# Patient Record
Sex: Female | Born: 1985 | Race: White | Hispanic: No | Marital: Married | Smoking: Current every day smoker
Health system: Southern US, Community
[De-identification: ages and names within clinical notes are randomized; demographics above are authoritative.]

## PROBLEM LIST (undated history)

## (undated) DIAGNOSIS — I10 Essential (primary) hypertension: Secondary | ICD-10-CM

## (undated) HISTORY — PX: TUBAL LIGATION: SHX77

---

## 2004-04-13 ENCOUNTER — Emergency Department: Payer: Self-pay | Admitting: Emergency Medicine

## 2004-07-18 ENCOUNTER — Emergency Department: Payer: Self-pay | Admitting: Emergency Medicine

## 2004-08-21 ENCOUNTER — Emergency Department: Payer: Self-pay | Admitting: Emergency Medicine

## 2004-08-28 ENCOUNTER — Emergency Department: Payer: Self-pay | Admitting: Internal Medicine

## 2004-09-05 ENCOUNTER — Emergency Department: Payer: Self-pay | Admitting: Emergency Medicine

## 2004-09-07 ENCOUNTER — Emergency Department: Payer: Self-pay | Admitting: Unknown Physician Specialty

## 2004-10-23 ENCOUNTER — Emergency Department: Payer: Self-pay | Admitting: Emergency Medicine

## 2004-10-24 ENCOUNTER — Emergency Department: Payer: Self-pay | Admitting: General Practice

## 2004-10-29 ENCOUNTER — Emergency Department: Payer: Self-pay | Admitting: Emergency Medicine

## 2004-10-30 ENCOUNTER — Emergency Department: Payer: Self-pay | Admitting: General Practice

## 2005-06-11 ENCOUNTER — Emergency Department: Payer: Self-pay | Admitting: Emergency Medicine

## 2005-06-17 ENCOUNTER — Emergency Department: Payer: Self-pay | Admitting: Emergency Medicine

## 2005-07-03 ENCOUNTER — Emergency Department: Payer: Self-pay | Admitting: Emergency Medicine

## 2005-07-08 ENCOUNTER — Emergency Department: Payer: Self-pay | Admitting: General Practice

## 2005-07-11 ENCOUNTER — Emergency Department: Payer: Self-pay | Admitting: Internal Medicine

## 2005-07-17 ENCOUNTER — Emergency Department: Payer: Self-pay | Admitting: Emergency Medicine

## 2005-07-25 ENCOUNTER — Emergency Department: Payer: Self-pay | Admitting: Emergency Medicine

## 2005-08-03 ENCOUNTER — Emergency Department: Payer: Self-pay | Admitting: Emergency Medicine

## 2005-08-04 ENCOUNTER — Emergency Department: Payer: Self-pay | Admitting: Emergency Medicine

## 2005-08-06 ENCOUNTER — Emergency Department: Payer: Self-pay | Admitting: Emergency Medicine

## 2005-10-01 ENCOUNTER — Emergency Department: Payer: Self-pay | Admitting: General Practice

## 2005-10-01 ENCOUNTER — Other Ambulatory Visit: Payer: Self-pay

## 2005-10-04 ENCOUNTER — Emergency Department: Payer: Self-pay | Admitting: Emergency Medicine

## 2006-03-23 ENCOUNTER — Emergency Department: Payer: Self-pay | Admitting: Emergency Medicine

## 2006-04-20 ENCOUNTER — Emergency Department: Payer: Self-pay | Admitting: Emergency Medicine

## 2006-06-19 ENCOUNTER — Emergency Department: Payer: Self-pay | Admitting: Emergency Medicine

## 2006-07-17 ENCOUNTER — Emergency Department (HOSPITAL_COMMUNITY): Admission: EM | Admit: 2006-07-17 | Discharge: 2006-07-17 | Payer: Self-pay | Admitting: Emergency Medicine

## 2006-08-18 ENCOUNTER — Emergency Department (HOSPITAL_COMMUNITY): Admission: EM | Admit: 2006-08-18 | Discharge: 2006-08-19 | Payer: Self-pay | Admitting: Emergency Medicine

## 2006-10-11 ENCOUNTER — Emergency Department: Payer: Self-pay | Admitting: Emergency Medicine

## 2006-12-30 ENCOUNTER — Emergency Department: Payer: Self-pay | Admitting: Emergency Medicine

## 2007-01-13 ENCOUNTER — Emergency Department: Payer: Self-pay | Admitting: Emergency Medicine

## 2007-02-05 ENCOUNTER — Emergency Department: Payer: Self-pay | Admitting: Emergency Medicine

## 2007-02-25 ENCOUNTER — Emergency Department (HOSPITAL_COMMUNITY): Admission: EM | Admit: 2007-02-25 | Discharge: 2007-02-25 | Payer: Self-pay | Admitting: Emergency Medicine

## 2007-03-16 ENCOUNTER — Other Ambulatory Visit: Payer: Self-pay

## 2007-03-16 ENCOUNTER — Emergency Department: Payer: Self-pay | Admitting: Emergency Medicine

## 2007-03-20 ENCOUNTER — Emergency Department: Payer: Self-pay | Admitting: Emergency Medicine

## 2007-03-20 ENCOUNTER — Emergency Department (HOSPITAL_COMMUNITY): Admission: EM | Admit: 2007-03-20 | Discharge: 2007-03-20 | Payer: Self-pay | Admitting: Emergency Medicine

## 2007-03-22 IMAGING — CT CT CERVICAL SPINE WITHOUT CONTRAST
3 series · 16 of 33 positions shown, 19 images · non-contrast
Comparison: none

REASON FOR EXAM: Injury
COMMENTS:

PROCEDURE:     CT  - CT CERVICAL SPINE WO  - August 04, 2005  [DATE]
RESULT:     Cervical spine CT scan was performed. Axial, sagittal and
coronal imaging was obtained.

[Series 2: axial · axial · 0.36mm/px · z∈[+192,+366]mm · 8 of 103 slices shown, 10 images]
[im 8/103  soft-tissue]
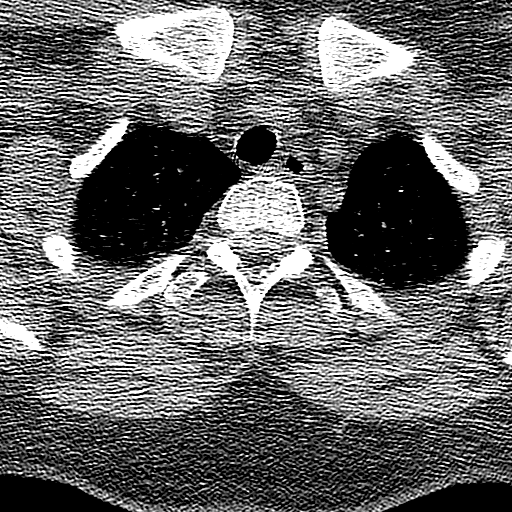
[im 8/103  bone]
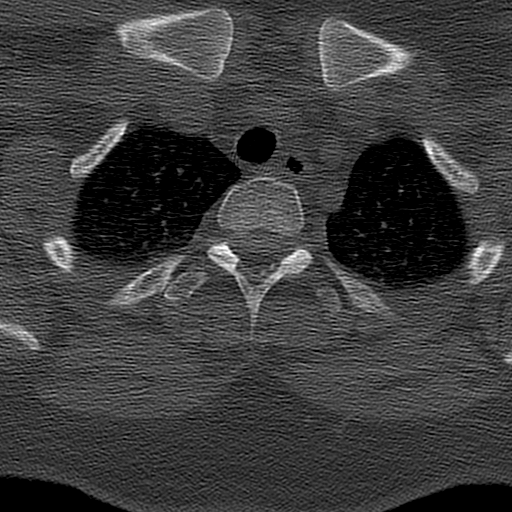
[im 24/103  bone]
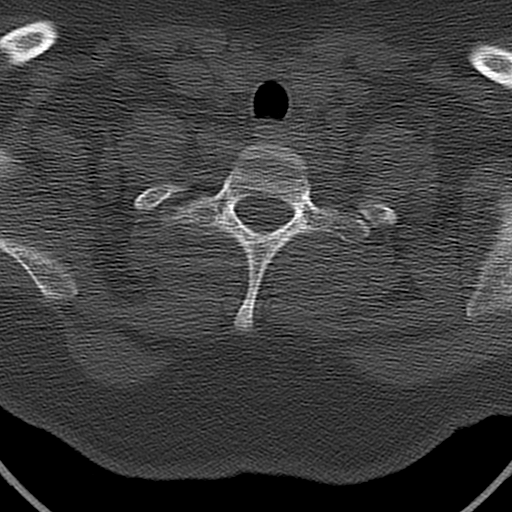
[im 32/103  bone]
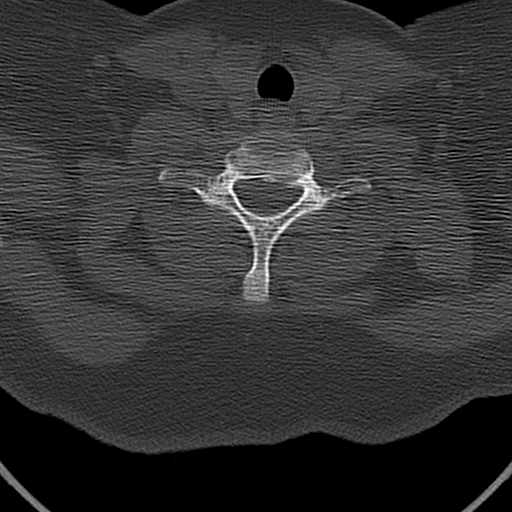
[im 48/103  bone]
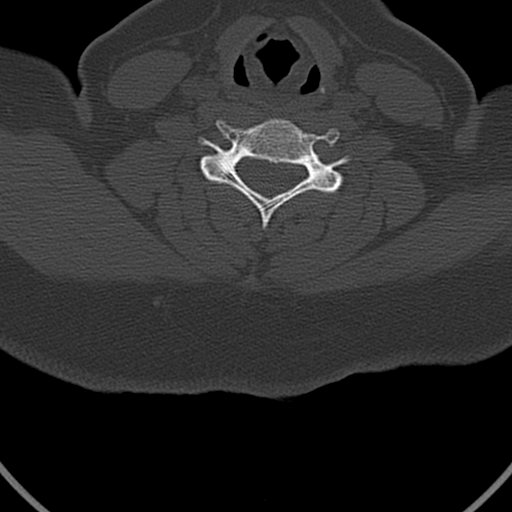
[im 55/103  soft-tissue]
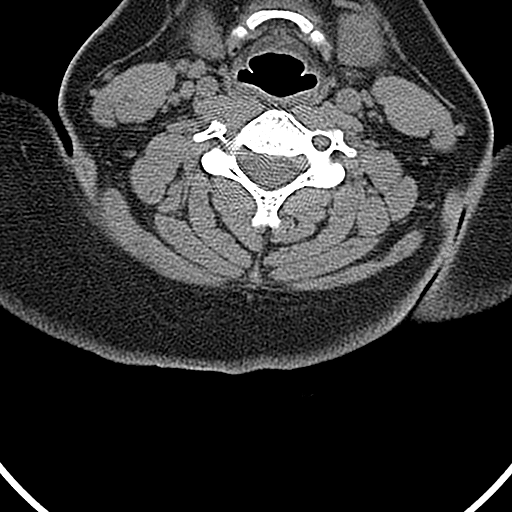
[im 55/103  bone]
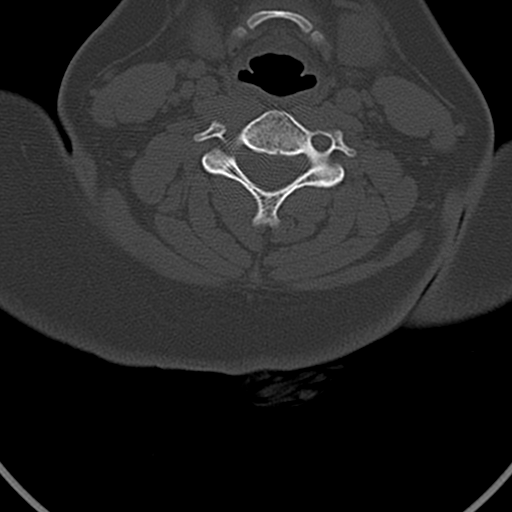
[im 71/103  bone]
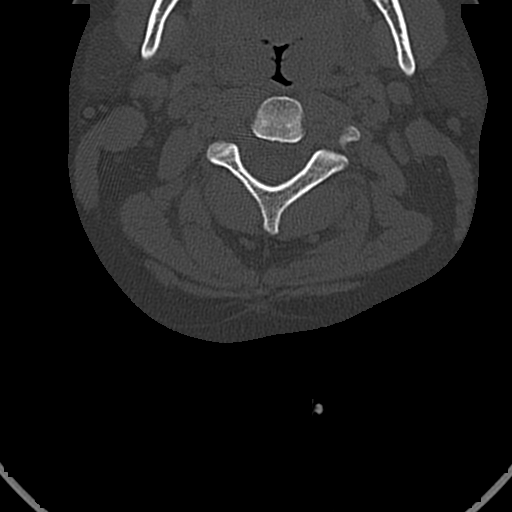
[im 79/103  bone]
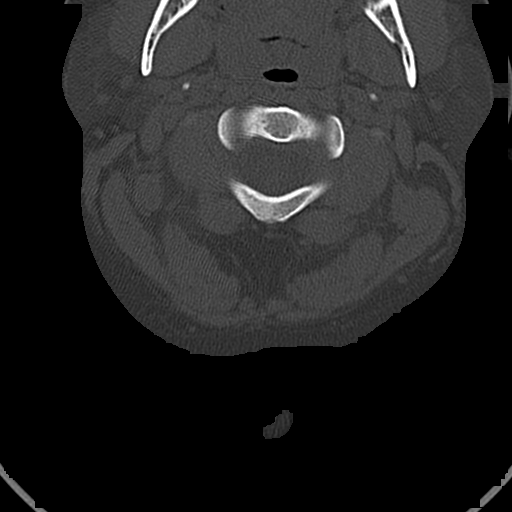
[im 95/103  bone]
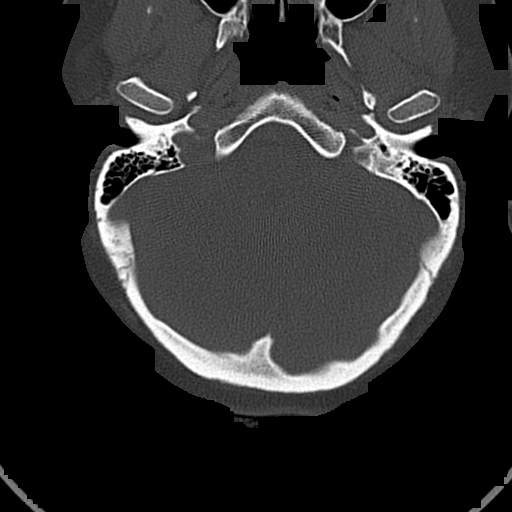

[Series 4: coronal · coronal · 0.39mm/px · 3 of 56 slices shown]
[im 12/56  bone]
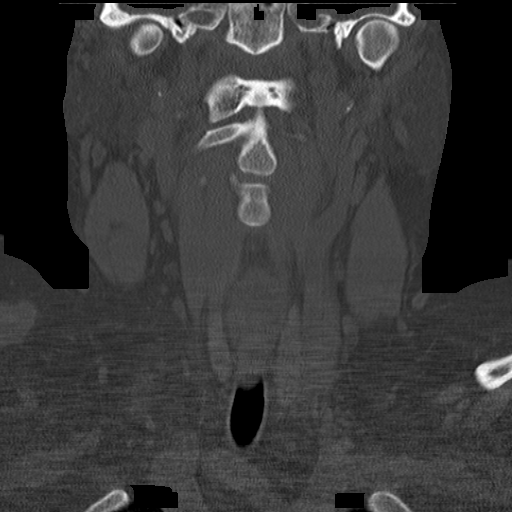
[im 23/56  bone]
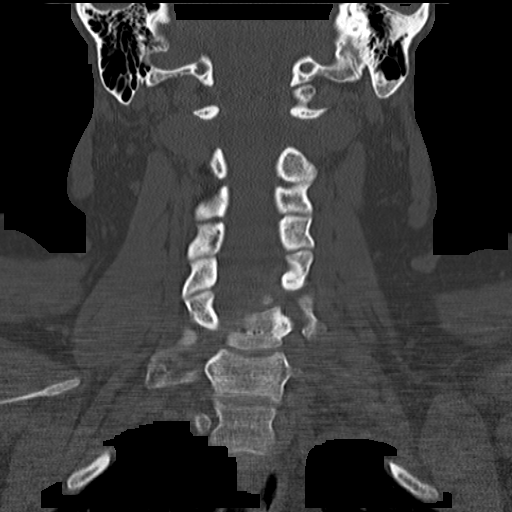
[im 34/56  bone]
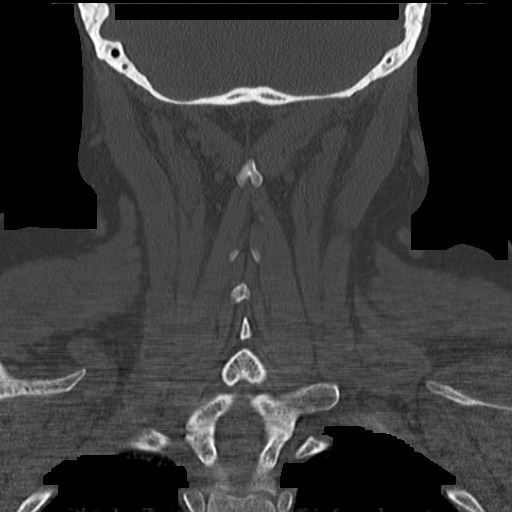

[Series 6: sagittal · sagittal · 0.40mm/px · 5 of 64 slices shown, 6 images]
[im 22/64  bone]
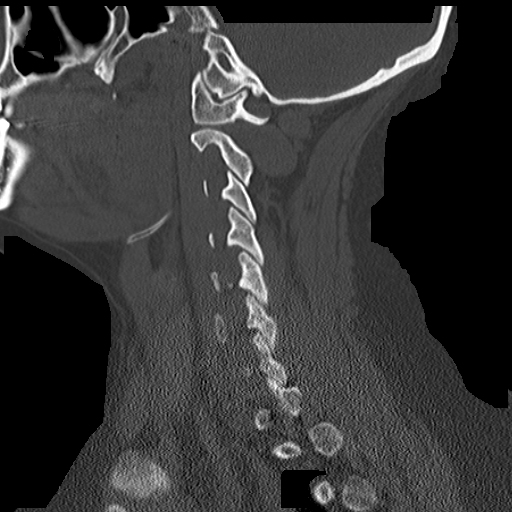
[im 27/64  bone]
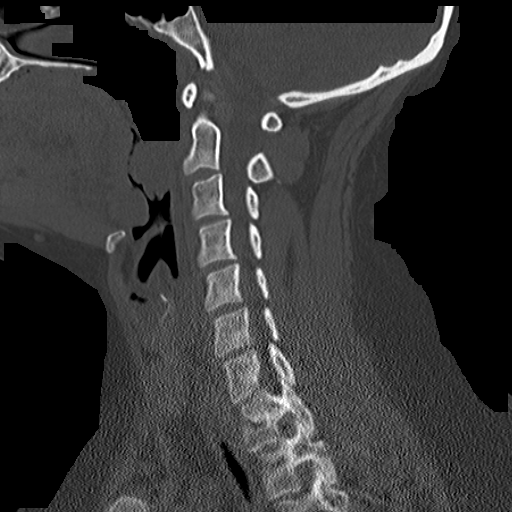
[im 32/64  soft-tissue]
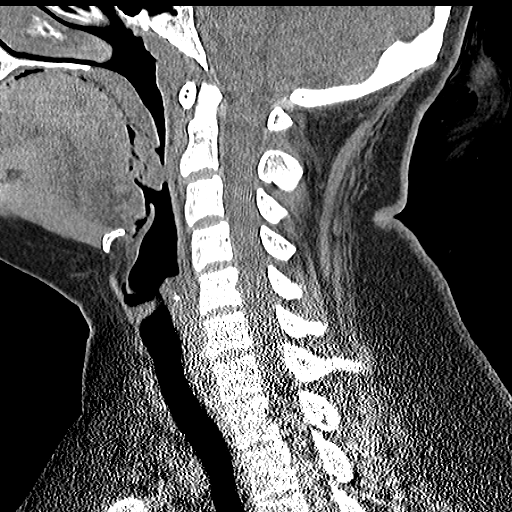
[im 32/64  bone]
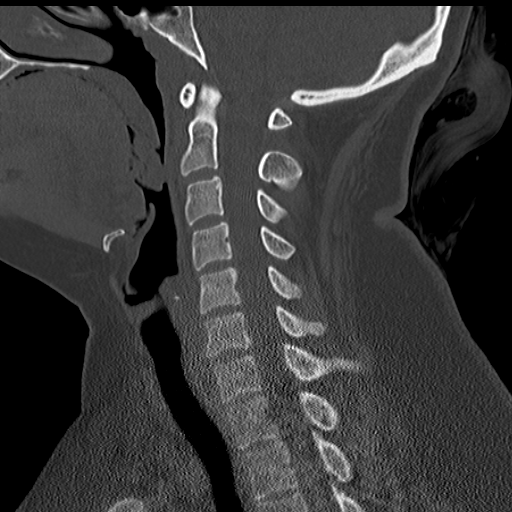
[im 37/64  bone]
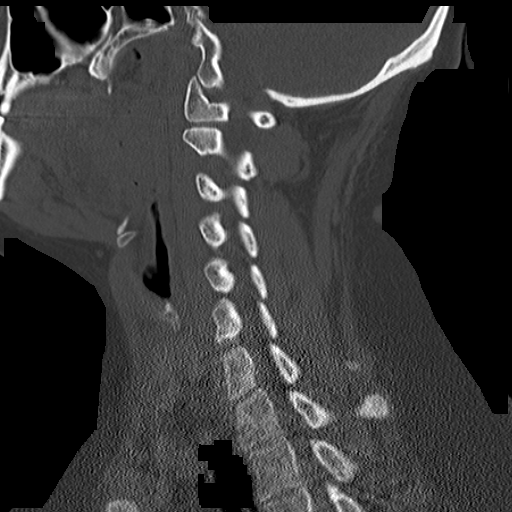
[im 43/64  bone]
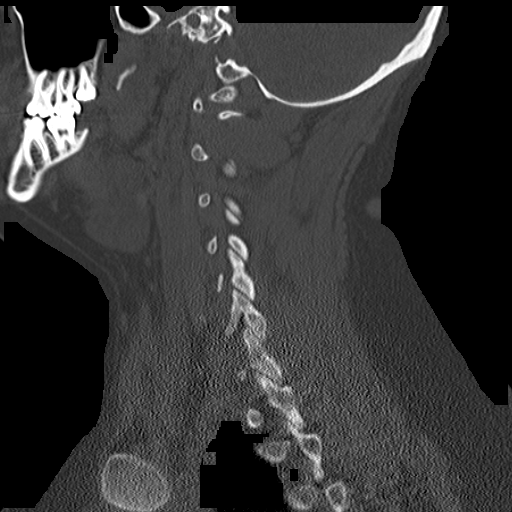

[16 of 33 positions shown; findings below may reference images not displayed]

FINDINGS: Normal alignment and curvature is noted. The vertebral body
heights are intact. The intervertebral disc spaces are intact. The odontoid
appears intact. No acute fractures are noted.
IMPRESSION: No fracture is identified. The vertebral body heights are
maintained.

The report was called to the Emergency Room at the conclusion of dictation.

## 2007-04-18 ENCOUNTER — Emergency Department: Payer: Self-pay | Admitting: Emergency Medicine

## 2007-06-26 ENCOUNTER — Emergency Department: Payer: Self-pay | Admitting: Emergency Medicine

## 2007-11-19 ENCOUNTER — Emergency Department: Payer: Self-pay | Admitting: Emergency Medicine

## 2007-12-16 ENCOUNTER — Emergency Department (HOSPITAL_COMMUNITY): Admission: EM | Admit: 2007-12-16 | Discharge: 2007-12-16 | Payer: Self-pay | Admitting: Emergency Medicine

## 2008-01-04 ENCOUNTER — Emergency Department (HOSPITAL_COMMUNITY): Admission: EM | Admit: 2008-01-04 | Discharge: 2008-01-04 | Payer: Self-pay | Admitting: Emergency Medicine

## 2008-02-16 ENCOUNTER — Emergency Department (HOSPITAL_COMMUNITY): Admission: EM | Admit: 2008-02-16 | Discharge: 2008-02-16 | Payer: Self-pay | Admitting: Emergency Medicine

## 2008-07-07 ENCOUNTER — Emergency Department: Payer: Self-pay | Admitting: Internal Medicine

## 2008-07-10 ENCOUNTER — Emergency Department: Payer: Self-pay | Admitting: Emergency Medicine

## 2008-07-14 ENCOUNTER — Emergency Department: Payer: Self-pay | Admitting: Emergency Medicine

## 2008-08-03 ENCOUNTER — Emergency Department: Payer: Self-pay | Admitting: Emergency Medicine

## 2008-09-18 ENCOUNTER — Emergency Department: Payer: Self-pay | Admitting: Emergency Medicine

## 2008-10-31 IMAGING — CT CT MAXILLOFACIAL WITHOUT CONTRAST
1 series · 16 of 38 positions shown, 20 images · non-contrast
Comparison: none

REASON FOR EXAM: FALL
COMMENTS:

[Series 4: coronal · coronal · 0.35mm/px · 16 of 39 slices shown, 20 images]
[im 3/39  brain]
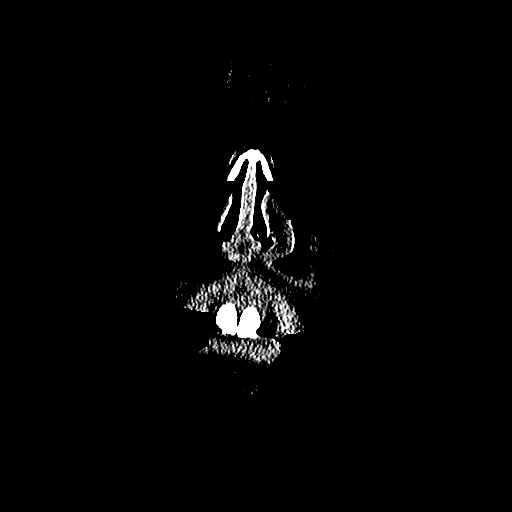
[im 3/39  bone]
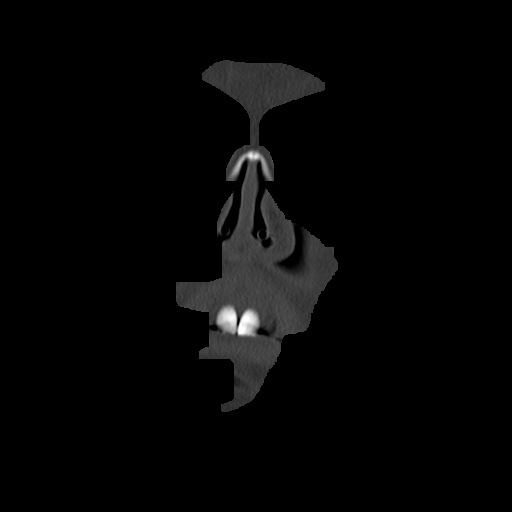
[im 5/39  bone]
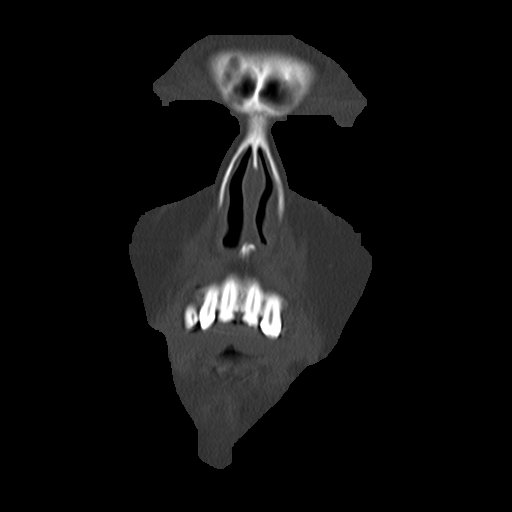
[im 7/39  bone]
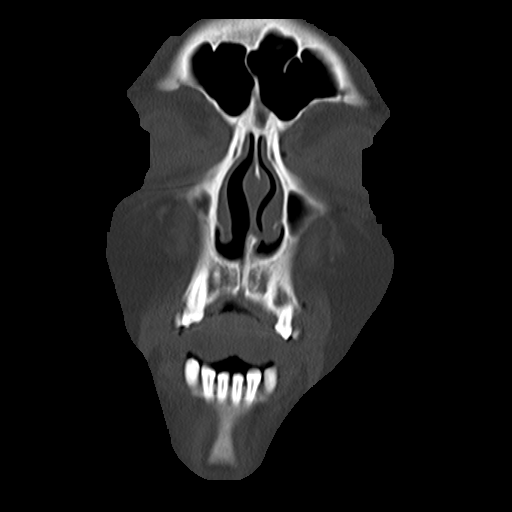
[im 9/39  bone]
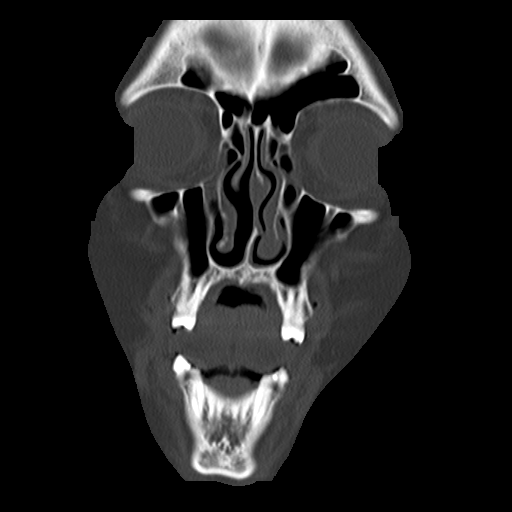
[im 12/39  brain]
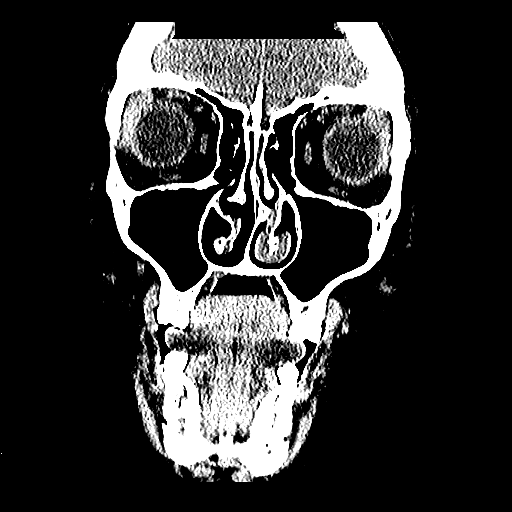
[im 12/39  bone]
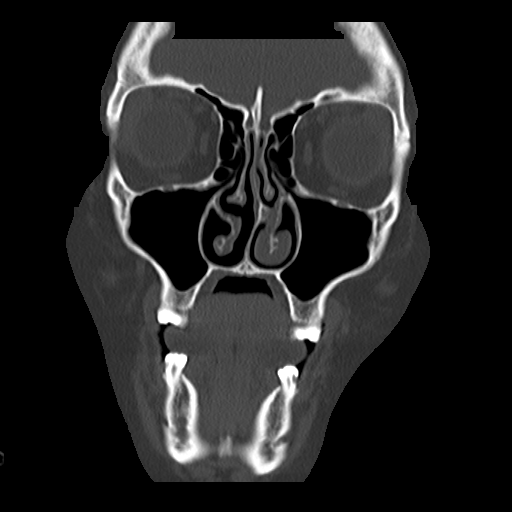
[im 14/39  bone]
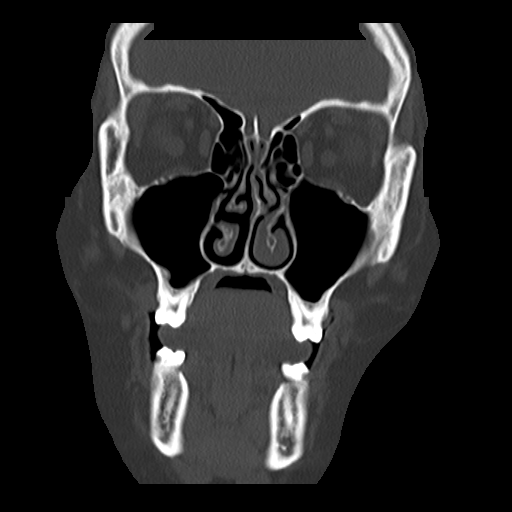
[im 16/39  bone]
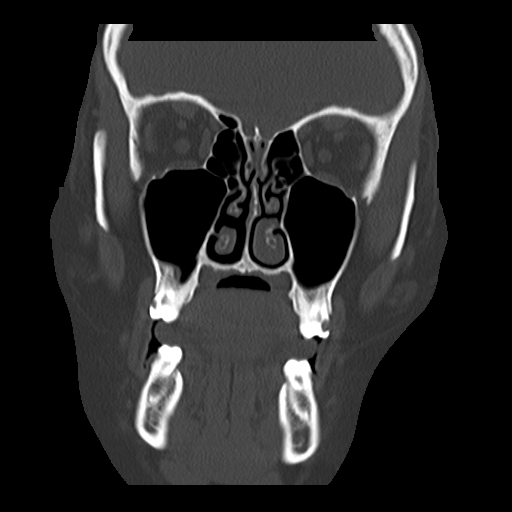
[im 18/39  bone]
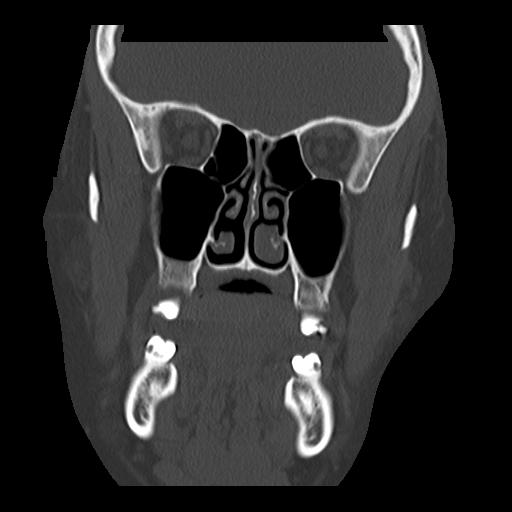
[im 21/39  brain]
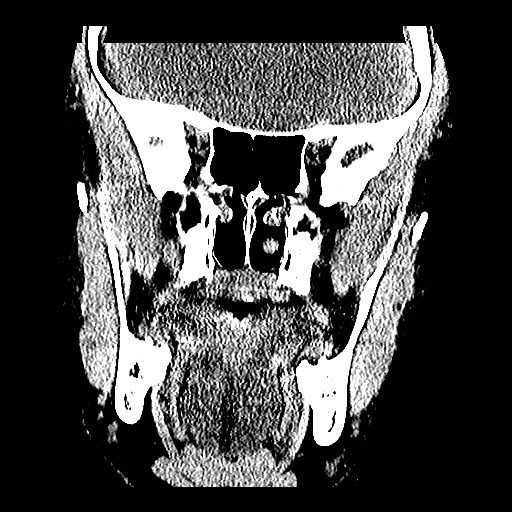
[im 21/39  bone]
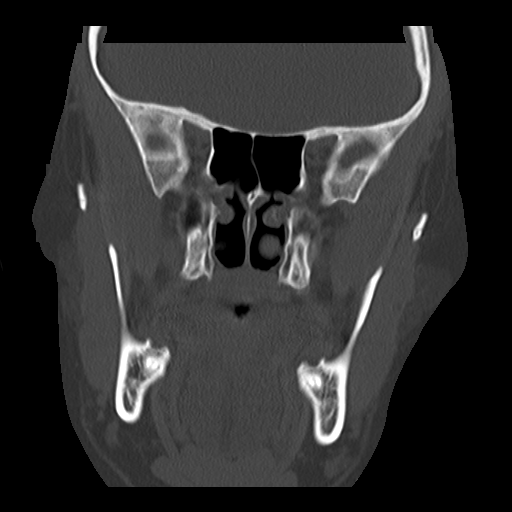
[im 23/39  bone]
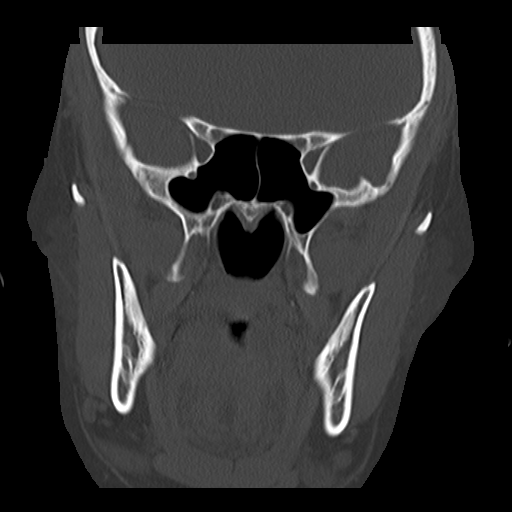
[im 25/39  bone]
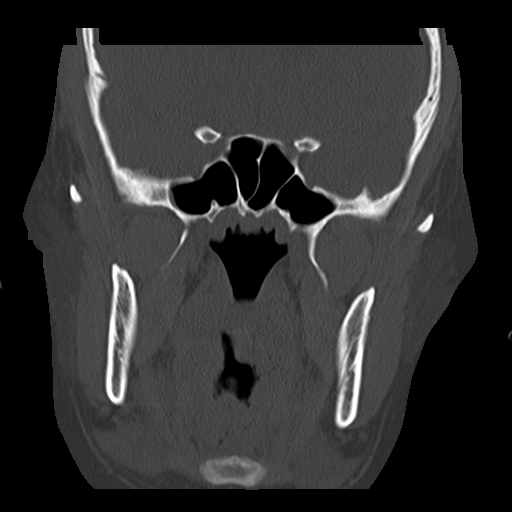
[im 27/39  bone]
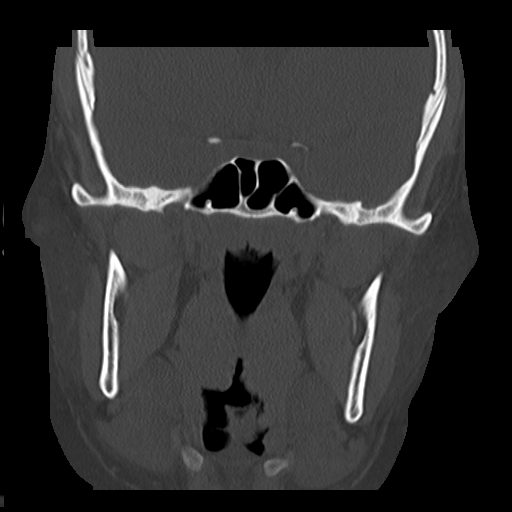
[im 30/39  brain]
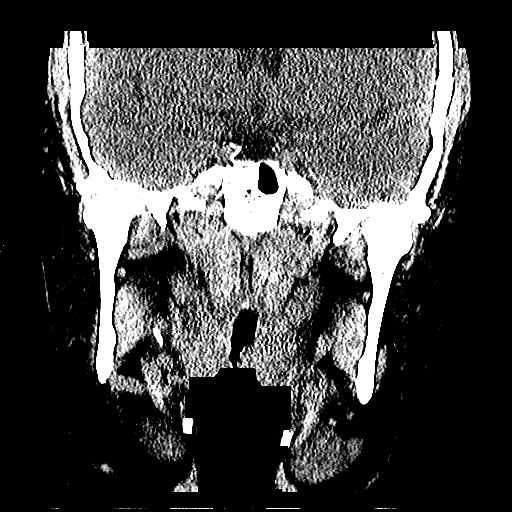
[im 30/39  bone]
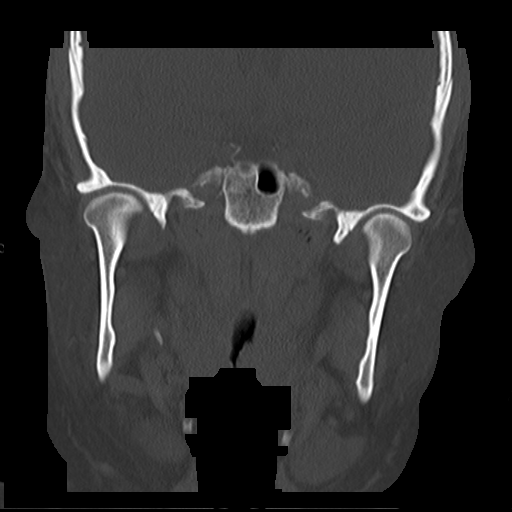
[im 32/39  bone]
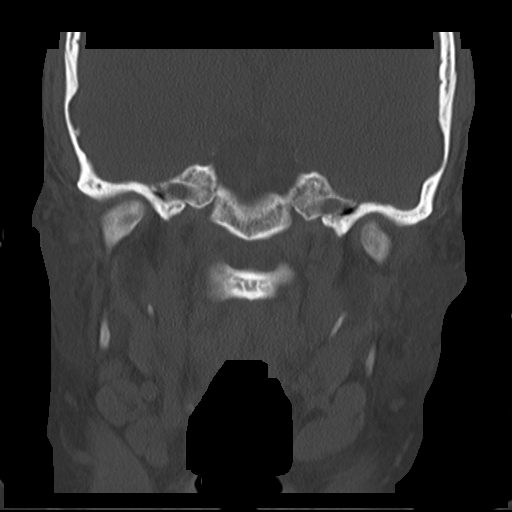
[im 34/39  bone]
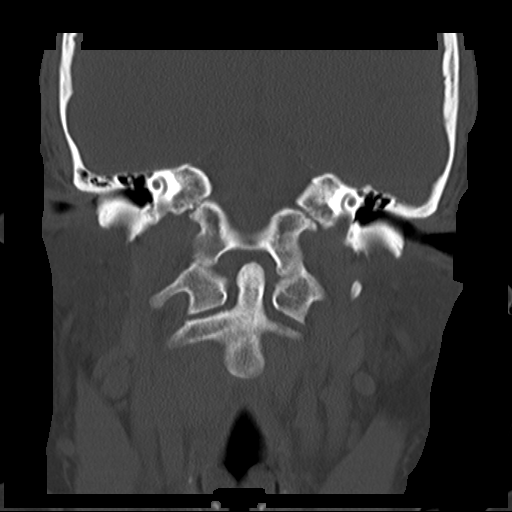
[im 36/39  bone]
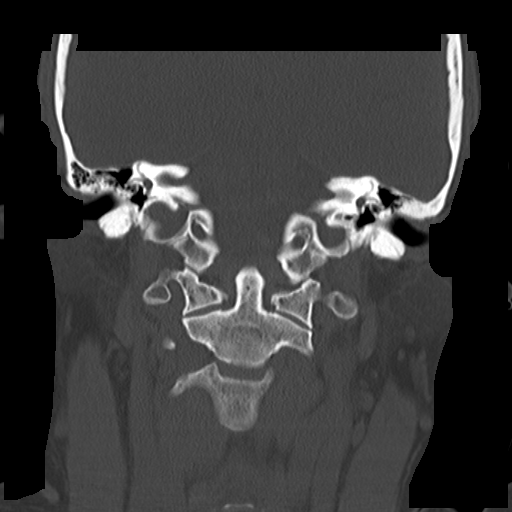

[16 of 38 positions shown; findings below may reference images not displayed]

PROCEDURE:     CT  - CT MAXILLOFACIAL AREA WO  - March 16, 2007  [DATE]

RESULT:     Multislice helical acquisition through the maxillofacial region
is reconstructed at bone window settings in the axial and coronal planes.
There is no previous exam for comparison.

There is no fracture. The sinuses are normally aerated. No air-fluid levels
are seen. There is no bony thickening or destruction. The included mastoids
show grossly normal aeration.
IMPRESSION: 1.     No acute maxillofacial fracture evident.

## 2008-11-18 ENCOUNTER — Emergency Department: Payer: Self-pay | Admitting: Emergency Medicine

## 2009-02-11 IMAGING — CR DG KNEE 1-2V*L*
1 series · 2 of 2 positions shown · non-contrast
Comparison: none

REASON FOR EXAM: pain and swelling
COMMENTS:

[Series 1: view not recorded · 0.17mm/px · 2 of 2 slices shown]
[im 1/2]
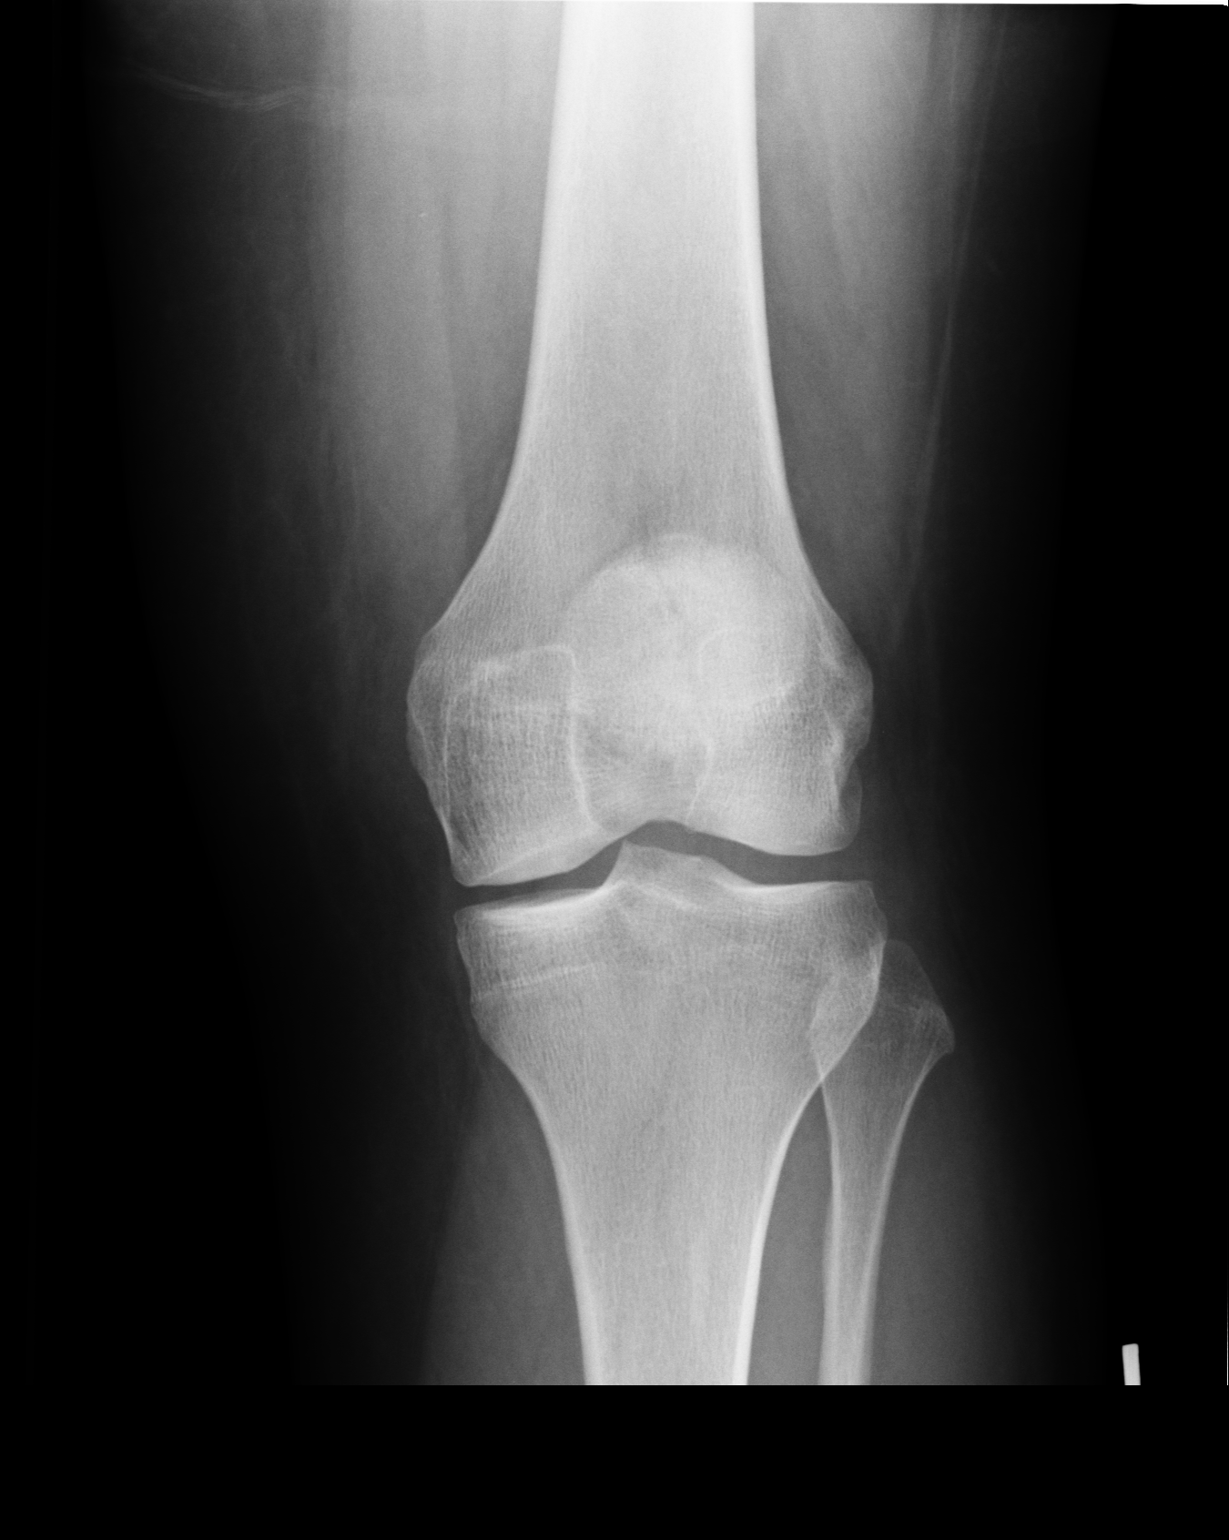
[im 2/2]
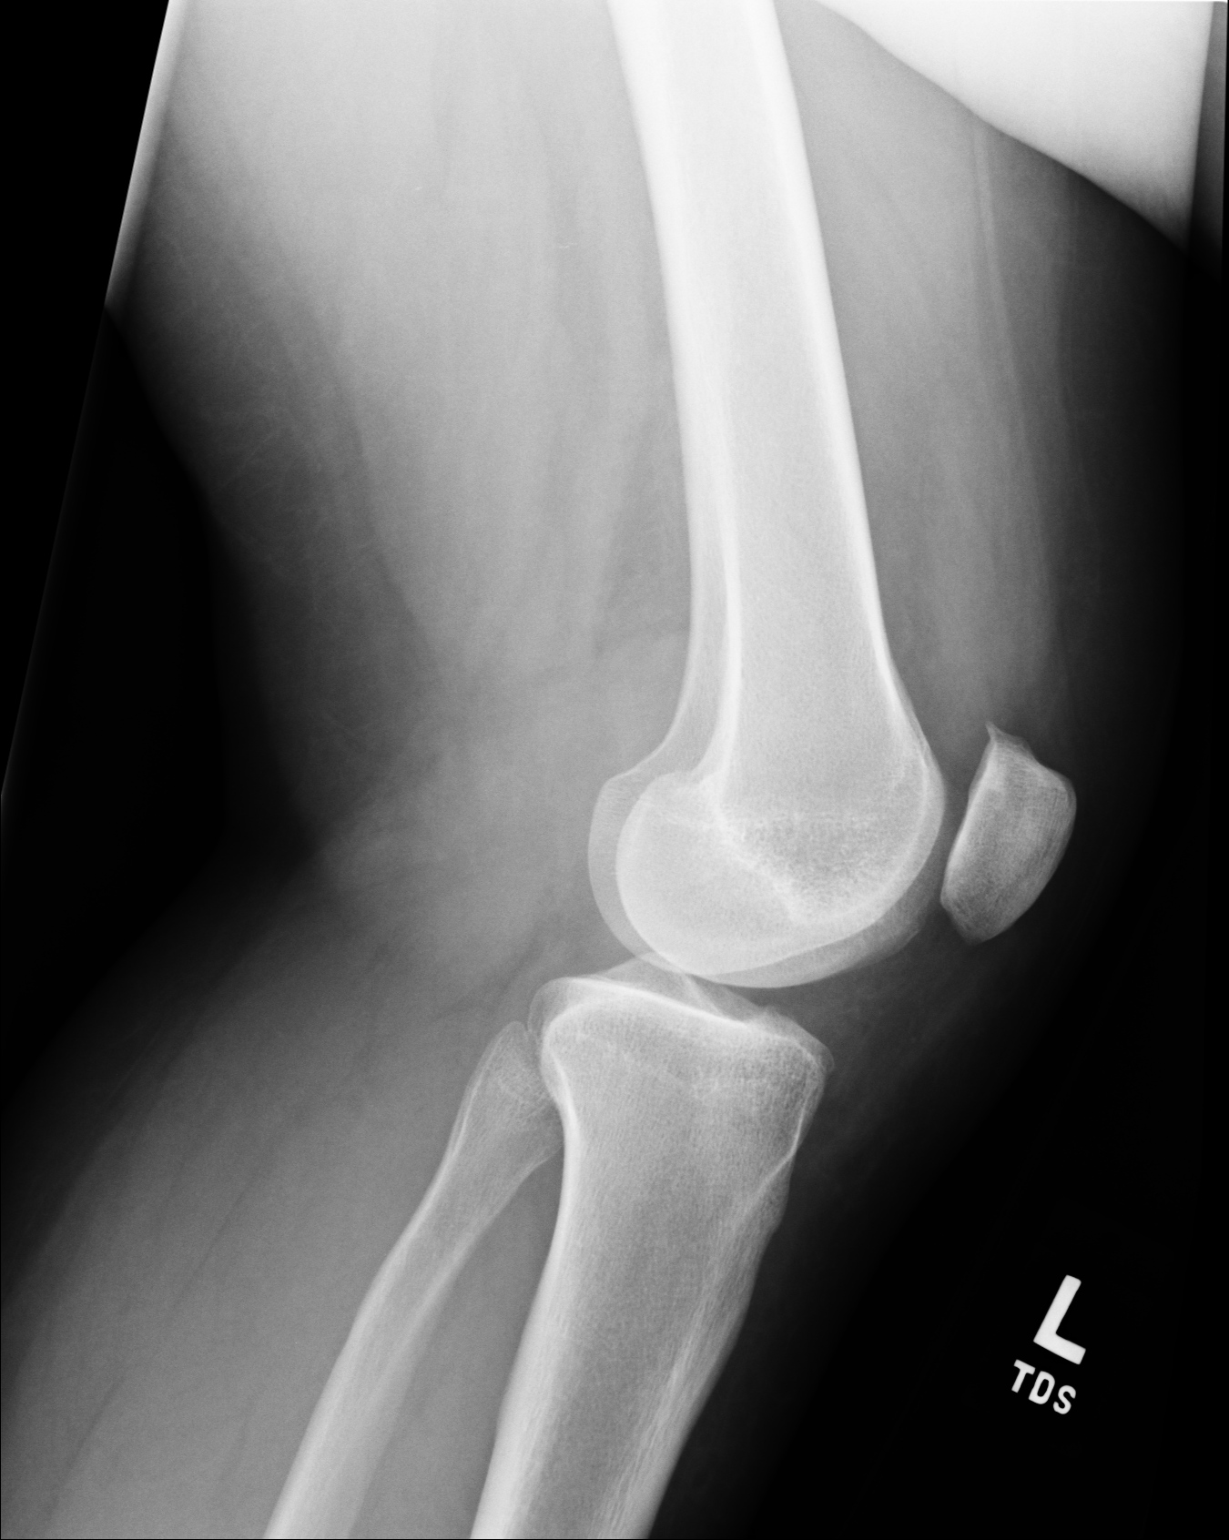

[2 of 2 positions shown; findings below may reference images not displayed]

PROCEDURE:     DXR - DXR KNEE LEFT AP AND LATERAL  - June 27, 2007  [DATE]

RESULT:     No fracture, dislocation or other acute bony abnormality is
noted. The knee joint space is well maintained. In the lateral view there is
noted slight dorsal patella spurring along the superior aspect of the
patella.
IMPRESSION: 1. No acute bony abnormalities are identified.
2. Dorsal patella spurring is noted along the superior aspect of the patella.

## 2009-02-15 ENCOUNTER — Emergency Department: Payer: Self-pay | Admitting: Internal Medicine

## 2009-03-10 ENCOUNTER — Emergency Department: Payer: Self-pay | Admitting: Emergency Medicine

## 2009-03-17 ENCOUNTER — Emergency Department: Payer: Self-pay | Admitting: Emergency Medicine

## 2009-05-24 ENCOUNTER — Emergency Department: Payer: Self-pay | Admitting: Unknown Physician Specialty

## 2009-06-25 ENCOUNTER — Emergency Department: Payer: Self-pay | Admitting: Emergency Medicine

## 2009-07-07 ENCOUNTER — Emergency Department: Payer: Self-pay | Admitting: Emergency Medicine

## 2009-10-09 ENCOUNTER — Emergency Department: Payer: Self-pay | Admitting: Emergency Medicine

## 2010-01-04 ENCOUNTER — Emergency Department: Payer: Self-pay | Admitting: Internal Medicine

## 2010-03-06 ENCOUNTER — Emergency Department: Payer: Self-pay | Admitting: Emergency Medicine

## 2010-04-02 ENCOUNTER — Emergency Department: Payer: Self-pay | Admitting: Emergency Medicine

## 2010-06-01 ENCOUNTER — Encounter: Payer: Self-pay | Admitting: Maternal & Fetal Medicine

## 2010-06-22 ENCOUNTER — Encounter: Payer: Self-pay | Admitting: Obstetrics & Gynecology

## 2010-07-20 ENCOUNTER — Encounter: Payer: Self-pay | Admitting: Obstetrics and Gynecology

## 2010-08-18 ENCOUNTER — Ambulatory Visit: Payer: Self-pay | Admitting: Obstetrics and Gynecology

## 2010-09-12 ENCOUNTER — Ambulatory Visit: Payer: Self-pay | Admitting: Obstetrics and Gynecology

## 2010-10-02 LAB — URINALYSIS, ROUTINE W REFLEX MICROSCOPIC
Glucose, UA: NEGATIVE
Nitrite: NEGATIVE
Specific Gravity, Urine: 1.023
pH: 7

## 2010-10-02 LAB — URINE MICROSCOPIC-ADD ON

## 2010-10-05 LAB — CBC
HCT: 42.6
Platelets: 325
RDW: 14.6
WBC: 9.3

## 2010-10-05 LAB — POCT PREGNANCY, URINE: Operator id: 288831

## 2010-10-05 LAB — URINALYSIS, ROUTINE W REFLEX MICROSCOPIC
Bilirubin Urine: NEGATIVE
Hgb urine dipstick: NEGATIVE
Ketones, ur: NEGATIVE
Nitrite: NEGATIVE
Protein, ur: NEGATIVE
Specific Gravity, Urine: 1.013
Urobilinogen, UA: 0.2

## 2010-10-05 LAB — DIFFERENTIAL
Basophils Absolute: 0.1
Eosinophils Absolute: 0.1
Eosinophils Relative: 1
Lymphocytes Relative: 27
Neutrophils Relative %: 64

## 2010-10-05 LAB — I-STAT 8, (EC8 V) (CONVERTED LAB)
Acid-base deficit: 2
Bicarbonate: 22.7
Chloride: 108
HCT: 45
Hemoglobin: 15.3 — ABNORMAL HIGH
Operator id: 288831
Sodium: 141
pCO2, Ven: 39.5 — ABNORMAL LOW

## 2010-10-05 LAB — RAPID URINE DRUG SCREEN, HOSP PERFORMED
Amphetamines: NOT DETECTED
Benzodiazepines: POSITIVE — AB
Cocaine: NOT DETECTED

## 2010-10-05 LAB — ETHANOL: Alcohol, Ethyl (B): 6

## 2010-10-10 ENCOUNTER — Observation Stay: Payer: Self-pay

## 2010-10-12 ENCOUNTER — Ambulatory Visit: Payer: Self-pay | Admitting: Obstetrics and Gynecology

## 2010-10-16 ENCOUNTER — Observation Stay: Payer: Self-pay

## 2010-10-16 LAB — URINALYSIS, ROUTINE W REFLEX MICROSCOPIC
Bilirubin Urine: NEGATIVE
Glucose, UA: NEGATIVE mg/dL
Hgb urine dipstick: NEGATIVE
Ketones, ur: NEGATIVE mg/dL
Ketones, ur: NEGATIVE mg/dL
Nitrite: NEGATIVE
Protein, ur: NEGATIVE mg/dL
Protein, ur: NEGATIVE mg/dL
Urobilinogen, UA: 0.2 mg/dL (ref 0.0–1.0)

## 2010-10-16 LAB — WET PREP, GENITAL
Clue Cells Wet Prep HPF POC: NONE SEEN
Trich, Wet Prep: NONE SEEN
WBC, Wet Prep HPF POC: NONE SEEN
Yeast Wet Prep HPF POC: NONE SEEN

## 2010-10-16 LAB — RAPID URINE DRUG SCREEN, HOSP PERFORMED
Amphetamines: NOT DETECTED
Opiates: NOT DETECTED
Tetrahydrocannabinol: POSITIVE — AB

## 2010-10-16 LAB — RPR: RPR Ser Ql: NONREACTIVE

## 2010-10-26 LAB — I-STAT 8, (EC8 V) (CONVERTED LAB)
BUN: 5 — ABNORMAL LOW
Chloride: 104
Glucose, Bld: 115 — ABNORMAL HIGH
Potassium: 3.3 — ABNORMAL LOW
pH, Ven: 7.394 — ABNORMAL HIGH

## 2010-10-26 LAB — URINE MICROSCOPIC-ADD ON

## 2010-10-26 LAB — URINALYSIS, ROUTINE W REFLEX MICROSCOPIC
Bilirubin Urine: NEGATIVE
Nitrite: NEGATIVE
Specific Gravity, Urine: 1.013
pH: 6

## 2010-10-26 LAB — DIFFERENTIAL
Basophils Absolute: 0
Lymphocytes Relative: 25
Monocytes Absolute: 0.6
Neutro Abs: 5.4

## 2010-10-26 LAB — CBC
Hemoglobin: 14.4
RDW: 14.4 — ABNORMAL HIGH

## 2010-11-08 ENCOUNTER — Inpatient Hospital Stay: Payer: Self-pay | Admitting: Obstetrics and Gynecology

## 2010-11-12 LAB — PATHOLOGY REPORT

## 2015-05-23 ENCOUNTER — Telehealth (HOSPITAL_COMMUNITY): Payer: Self-pay | Admitting: *Deleted

## 2015-06-02 ENCOUNTER — Telehealth (HOSPITAL_COMMUNITY): Payer: Self-pay | Admitting: *Deleted

## 2015-06-02 NOTE — Telephone Encounter (Signed)
spoke with pt to sch appt with provider due to ref from [facility] and per pt, [facility] so she do not need services from our office anymore.

## 2015-06-16 ENCOUNTER — Emergency Department (HOSPITAL_COMMUNITY)
Admission: EM | Admit: 2015-06-16 | Discharge: 2015-06-17 | Disposition: A | Discharge status: Home / Self Care | Payer: Self-pay | Attending: Emergency Medicine | Admitting: Emergency Medicine

## 2015-06-16 ENCOUNTER — Encounter (HOSPITAL_COMMUNITY): Payer: Self-pay | Admitting: *Deleted

## 2015-06-16 DIAGNOSIS — F17200 Nicotine dependence, unspecified, uncomplicated: Secondary | ICD-10-CM | POA: Insufficient documentation

## 2015-06-16 DIAGNOSIS — M791 Myalgia: Secondary | ICD-10-CM | POA: Insufficient documentation

## 2015-06-16 DIAGNOSIS — R0789 Other chest pain: Secondary | ICD-10-CM | POA: Insufficient documentation

## 2015-06-16 NOTE — ED Notes (Signed)
Pt c/o central chest pain that radiates to left arm and middle of shoulder blades; pt states her BP has been running high

## 2015-06-16 NOTE — ED Provider Notes (Signed)
CSN: [id number]     Arrival date & time 06/16/15  2213 History  By signing my name below, I, [facility] Worth[facility]arrissa Andrews, attest that this documentation has been prepared under the direction and in the presence of Shon Batonourtney F Horton, MD. Electronically Signed: Randell PatientMarrissa Andrews, ED Scribe. 06/17/2015. 12:30 AM.   Chief Complaint  Patient presents with  . Chest Pain    The history is provided by the patient. No language interpreter was used.   HPI Comments: Shannon Andrews is a 30 y.o. female who presents to the Emergency Department complaining of constant, left-sided, sharp CP onset 2 days ago. Pt states that her symptoms began with left arm pain that radiated from her left shoulder blade down into her arm followed by SOB and then by gradual CP. She reports breast tenderness around her bilateral nipples. CP is improved with laying still. Per pt, she.has been stressed and anxious recently due to her father committing suicide 2 months ago. Denies hx of DVTs, PEs, DM, and HTN. Denies pregnancy and notes that she had a tubal ligation. Denies recent surgeries and long travel. Denies leg swelling, SOB currently, or any other symptoms currently. LMP 06/01/15  History reviewed. No pertinent past medical history. Past Surgical History  Procedure Laterality Date  . Tubal ligation     History reviewed. No pertinent family history. Social History  Substance Use Topics  . Smoking status: Current Every Day Smoker -- 1.00 packs/day  . Smokeless tobacco: None  . Alcohol Use: No   OB History    No data available     Review of Systems  Respiratory: Positive for shortness of breath (none currently).   Cardiovascular: Positive for chest pain. Negative for leg swelling.  Musculoskeletal: Positive for myalgias (left arm).  Psychiatric/Behavioral: The patient is nervous/anxious.   All other systems reviewed and are negative.     Allergies  Amoxicillin and Penicillins  Home Medications   Prior to Admission  medications   Not on File   BP 153/88 mmHg  Pulse 92  Temp(Src) 98.8 F (37.1 C) (Oral)  Resp 23  Ht 5\' 6"  (1.676 m)  Wt 202 lb (91.627 kg)  BMI 32.62 kg/m2  SpO2 100%  LMP 06/01/2015 Physical Exam  Constitutional: She is oriented to person, place, and time. She appears well-developed and well-nourished.  HENT:  Head: Normocephalic and atraumatic.  Cardiovascular: Normal rate, regular rhythm and normal heart sounds.   Pulmonary/Chest: Effort normal. No respiratory distress. She has no wheezes. She exhibits tenderness.  Musculoskeletal: She exhibits no edema.  Neurological: She is alert and oriented to person, place, and time.  Skin: Skin is warm and dry.  Psychiatric: She has a normal mood and affect.  Nursing note and vitals reviewed.   ED Course  Procedures (including critical care time)  DIAGNOSTIC STUDIES: Oxygen Saturation is 100% on RA, normal by my interpretation.    COORDINATION OF CARE: 11:42 PM Discussed results of EKG. Will order labs and chest x-ray. Discussed treatment plan with pt at bedside and pt agreed to plan.   Labs Review Labs Reviewed  D-DIMER, QUANTITATIVE (NOT [facility])  TROPONIN I  PREGNANCY, URINE    Imaging Review Dg Chest 2 View  06/17/2015  CLINICAL DATA:  Initial evaluation for acute chest pain. EXAM: CHEST  2 VIEW COMPARISON:  None. FINDINGS: The heart size and mediastinal contours are within normal limits. Both lungs are clear. The visualized skeletal structures are unremarkable. IMPRESSION: No active cardiopulmonary disease. Electronically Signed  By: Rise Mu M.D.   On: 06/17/2015 01:22   I have personally reviewed and evaluated these images and lab results as part of my medical decision-making.   EKG Interpretation   Date/Time:  Monday June 16 2015 22:31:01 EDT Ventricular Rate:  104 PR Interval:  158 QRS Duration: 95 QT Interval:  357 QTC Calculation: 470 R Axis:   85 Text Interpretation:  Sinus tachycardia  Confirmed by HORTON  MD, COURTNEY  ([id number]) on 06/16/2015 11:07:40 PM      MDM   Final diagnoses:  Other chest pain    Patient presents for chest pain. On last several days. Does report musculoskeletal left shoulder pain as well. Chest pain is reproducible on exam. However, patient was noted to be tachycardic on her EKG. No other risk factors for PE. Troponin is negative. D-dimer is negative. Chest x-ray shows no evidence of pneumothorax or pneumonia. Workup is reassuring.  After history, exam, and medical workup I feel the patient has been appropriately medically screened and is safe for discharge home. Pertinent diagnoses were discussed with the patient. Patient was given return precautions.  I personally performed the services described in this documentation, which was scribed in my presence. The recorded information has been reviewed and is accurate.    Shon Baton, MD 06/17/15 [phone]

## 2015-06-17 ENCOUNTER — Emergency Department (HOSPITAL_COMMUNITY): Payer: Self-pay

## 2015-06-17 LAB — PREGNANCY, URINE: Preg Test, Ur: NEGATIVE

## 2015-06-17 LAB — D-DIMER, QUANTITATIVE (NOT AT ARMC)

## 2015-06-17 LAB — TROPONIN I: Troponin I: <0.03 ng/mL (ref <0.031)

## 2015-06-17 NOTE — Discharge Instructions (Signed)

## 2015-06-17 NOTE — ED Notes (Signed)
Pt assisted to bathroom and urine specimen obtained and sent to lab

## 2015-08-22 ENCOUNTER — Emergency Department (HOSPITAL_COMMUNITY)
Admission: EM | Admit: 2015-08-22 | Discharge: 2015-08-23 | Disposition: A | Discharge status: Home / Self Care | Payer: Self-pay | Attending: Emergency Medicine | Admitting: Emergency Medicine

## 2015-08-22 ENCOUNTER — Emergency Department (HOSPITAL_COMMUNITY): Payer: Self-pay

## 2015-08-22 ENCOUNTER — Encounter (HOSPITAL_COMMUNITY): Payer: Self-pay | Admitting: Emergency Medicine

## 2015-08-22 DIAGNOSIS — R110 Nausea: Secondary | ICD-10-CM | POA: Insufficient documentation

## 2015-08-22 DIAGNOSIS — K5900 Constipation, unspecified: Secondary | ICD-10-CM | POA: Insufficient documentation

## 2015-08-22 DIAGNOSIS — F17200 Nicotine dependence, unspecified, uncomplicated: Secondary | ICD-10-CM | POA: Insufficient documentation

## 2015-08-22 DIAGNOSIS — R109 Unspecified abdominal pain: Secondary | ICD-10-CM | POA: Insufficient documentation

## 2015-08-22 LAB — COMPREHENSIVE METABOLIC PANEL
ALT: 17 U/L (ref 14–54)
ANION GAP: 6 (ref 5–15)
AST: 19 U/L (ref 15–41)
Albumin: 3.3 g/dL — ABNORMAL LOW (ref 3.5–5.0)
Alkaline Phosphatase: 78 U/L (ref 38–126)
BILIRUBIN TOTAL: 0.3 mg/dL (ref 0.3–1.2)
BUN: 10 mg/dL (ref 6–20)
CALCIUM: 8.2 mg/dL — AB (ref 8.9–10.3)
CHLORIDE: 106 mmol/L (ref 101–111)
CO2: 24 mmol/L (ref 22–32)
CREATININE: 0.77 mg/dL (ref 0.44–1.00)
GFR calc Af Amer: >60 mL/min (ref >60)
GLUCOSE: 104 mg/dL — AB (ref 65–99)
POTASSIUM: 3.7 mmol/L (ref 3.5–5.1)
Sodium: 136 mmol/L (ref 135–145)
Total Protein: 7.2 g/dL (ref 6.5–8.1)

## 2015-08-22 LAB — CBC
HCT: 35.6 % — ABNORMAL LOW (ref 36.0–46.0)
Hemoglobin: 11.5 g/dL — ABNORMAL LOW (ref 12.0–15.0)
MCH: 24.7 pg — AB (ref 26.0–34.0)
MCHC: 32.3 g/dL (ref 30.0–36.0)
MCV: 76.6 fL — ABNORMAL LOW (ref 78.0–100.0)
PLATELETS: 351 10*3/uL (ref 150–400)
RBC: 4.65 MIL/uL (ref 3.87–5.11)
RDW: 16.1 % — AB (ref 11.5–15.5)
WBC: 12.2 10*3/uL — ABNORMAL HIGH (ref 4.0–10.5)

## 2015-08-22 LAB — URINALYSIS, ROUTINE W REFLEX MICROSCOPIC
BILIRUBIN URINE: NEGATIVE
Glucose, UA: NEGATIVE mg/dL
Ketones, ur: NEGATIVE mg/dL
Leukocytes, UA: NEGATIVE
Nitrite: NEGATIVE
PROTEIN: NEGATIVE mg/dL
SPECIFIC GRAVITY, URINE: 1.025 (ref 1.005–1.030)
pH: 6 (ref 5.0–8.0)

## 2015-08-22 LAB — URINE MICROSCOPIC-ADD ON

## 2015-08-22 LAB — LIPASE, BLOOD: LIPASE: 20 U/L (ref 11–51)

## 2015-08-22 LAB — PREGNANCY, URINE: PREG TEST UR: NEGATIVE

## 2015-08-22 MED ORDER — ONDANSETRON HCL 4 MG/2ML IJ SOLN
4.0000 mg | Freq: Once | INTRAMUSCULAR | Status: AC
  Administered 2015-08-22: 4 mg via INTRAVENOUS
  Filled 2015-08-22: qty 2

## 2015-08-22 MED ORDER — DIATRIZOATE MEGLUMINE & SODIUM 66-10 % PO SOLN
ORAL | Status: AC
  Filled 2015-08-22: qty 30

## 2015-08-22 MED ORDER — MORPHINE SULFATE (PF) 4 MG/ML IV SOLN
4.0000 mg | Freq: Once | INTRAVENOUS | Status: AC
Start: 2015-08-22 — End: 2015-08-22
  Administered 2015-08-22: 4 mg via INTRAVENOUS
  Filled 2015-08-22: qty 1

## 2015-08-22 MED ORDER — IOPAMIDOL (ISOVUE-300) INJECTION 61%
100.0000 mL | Freq: Once | INTRAVENOUS | Status: AC | PRN
  Administered 2015-08-22: 100 mL via INTRAVENOUS

## 2015-08-22 NOTE — ED Provider Notes (Signed)
[facility]-EMER[facility]ENCY DEPT Provider Note   CSN: [id number] Arrival date & time: 08/22/15  1942  First Provider Contact:  None    By signing my name below, I, Emmanuella Mensah, attest that this documentation has been prepared under the direction and in the presence of Cathren Laine, MD. Electronically Signed: Angelene Giovanni, ED Scribe. 08/22/15. 10:39 PM.    History   Chief Complaint Chief Complaint  Patient presents with  . Abdominal Pain  . Nausea    HPI Comments: Shannon Andrews is a 30 y.o. female who presents to the Emergency Department complaining of gradually worsening sharp R sided abd pain onset 3 days ago. She notes that although she is currently on her normal menstrual cycle, this pain is different from her menstrual cramps. Last prior period to that was 1 month ago. Denies any abnormal bleeding or any vaginal discharge. She reports associated nausea, constipation, and decreased appetite. No alleviating factors noted. Pt has not tried any medications PTA. She denies a hx of cholecystectomy or appendectomy. She denies any diarrhea, dysuria, fever, or chills.   The history is provided by the patient. No language interpreter was used.    History reviewed. No pertinent past medical history.  There are no active problems to display for this patient.   Past Surgical History:  Procedure Laterality Date  . TUBAL LIGATION      OB History    No data available       Home Medications    Prior to Admission medications   Not on File    Family History History reviewed. No pertinent family history.  Social History Social History  Substance Use Topics  . Smoking status: Current Every Day Smoker    Packs/day: 1.00  . Smokeless tobacco: Never Used  . Alcohol use No     Allergies   Amoxicillin and Penicillins   Review of Systems Review of Systems  Constitutional: Positive for appetite change. Negative for fever.  HENT: Negative for sore throat.   Respiratory:  Negative for cough and shortness of breath.   Cardiovascular: Negative for chest pain.  Gastrointestinal: Positive for abdominal pain, constipation and nausea. Negative for diarrhea.  Genitourinary: Negative for dysuria, hematuria and vaginal discharge.  Musculoskeletal: Negative for back pain and neck pain.  Skin: Negative for rash.  All other systems reviewed and are negative.    Physical Exam Updated Vital Signs BP 151/92   Pulse 95   Temp 98.8 F (37.1 C) (Oral)   Resp 14   Ht  (1.676 m)   Wt 220 lb (99.8 kg)   LMP 08/21/2015   SpO2 99%   BMI 35.51 kg/m   Physical Exam  Constitutional: She appears well-developed and well-nourished. No distress.  HENT:  Head: Normocephalic and atraumatic.  Mouth/Throat: Oropharynx is clear and moist.  Eyes: Conjunctivae and EOM are normal.  Neck: Neck supple. No tracheal deviation present.  Cardiovascular: Normal rate, regular rhythm, normal heart sounds and intact distal pulses.  Exam reveals no gallop and no friction rub.   No murmur heard. Pulmonary/Chest: Effort normal and breath sounds normal. No respiratory distress.  Abdominal: Soft. Bowel sounds are normal. There is tenderness.  Right abd tenderness. No rebound or guarding. No incarc hernia.   Genitourinary:  Genitourinary Comments: No cva tenderness  Musculoskeletal: Normal range of motion.  Neurological: She is alert.  Skin: Skin is warm and dry. No rash noted. She is not diaphoretic.  Psychiatric: She has a normal mood and affect. Her  behavior is normal.  Nursing note and vitals reviewed.    ED Treatments / Results  DIAGNOSTIC STUDIES: Oxygen Saturation is 99% on RA, normal by my interpretation.    COORDINATION OF CARE: 10:25 PM- Pt advised of plan for treatment and pt agrees.    Labs (all labs ordered are listed, but only abnormal results are displayed) Results for orders placed or performed during the hospital encounter of 08/22/15  Lipase, blood  Result  Value Ref Range   Lipase 20 11 - 51 U/L  Comprehensive metabolic panel  Result Value Ref Range   Sodium 136 135 - 145 mmol/L   Potassium 3.7 3.5 - 5.1 mmol/L   Chloride 106 101 - 111 mmol/L   CO2 24 22 - 32 mmol/L   Glucose, Bld 104 (H) 65 - 99 mg/dL   BUN 10 6 - 20 mg/dL   Creatinine, Ser 1.61 0.44 - 1.00 mg/dL   Calcium 8.2 (L) 8.9 - 10.3 mg/dL   Total Protein 7.2 6.5 - 8.1 g/dL   Albumin 3.3 (L) 3.5 - 5.0 g/dL   AST 19 15 - 41 U/L   ALT 17 14 - 54 U/L   Alkaline Phosphatase 78 38 - 126 U/L   Total Bilirubin 0.3 0.3 - 1.2 mg/dL   GFR calc non Af Amer >60 >60 mL/min   GFR calc Af Amer >60 >60 mL/min   Anion gap 6 5 - 15  CBC  Result Value Ref Range   WBC 12.2 (H) 4.0 - 10.5 K/uL   RBC 4.65 3.87 - 5.11 MIL/uL   Hemoglobin 11.5 (L) 12.0 - 15.0 g/dL   HCT 09.6 (L) 04.5 - 40.9 %   MCV 76.6 (L) 78.0 - 100.0 fL   MCH 24.7 (L) 26.0 - 34.0 pg   MCHC 32.3 30.0 - 36.0 g/dL   RDW 81.1 (H) 91.4 - 78.2 %   Platelets 351 150 - 400 K/uL  Urinalysis, Routine w reflex microscopic  Result Value Ref Range   Color, Urine YELLOW YELLOW   APPearance CLEAR CLEAR   Specific Gravity, Urine 1.025 1.005 - 1.030   pH 6.0 5.0 - 8.0   Glucose, UA NEGATIVE NEGATIVE mg/dL   Hgb urine dipstick MODERATE (A) NEGATIVE   Bilirubin Urine NEGATIVE NEGATIVE   Ketones, ur NEGATIVE NEGATIVE mg/dL   Protein, ur NEGATIVE NEGATIVE mg/dL   Nitrite NEGATIVE NEGATIVE   Leukocytes, UA NEGATIVE NEGATIVE  Pregnancy, urine  Result Value Ref Range   Preg Test, Ur NEGATIVE NEGATIVE  Urine microscopic-add on  Result Value Ref Range   Squamous Epithelial / LPF 0-5 (A) NONE SEEN   WBC, UA 0-5 0 - 5 WBC/hpf   RBC / HPF 6-30 0 - 5 RBC/hpf   Bacteria, UA FEW (A) NONE SEEN   Ct Abdomen Pelvis W Contrast  Result Date: 08/22/2015 CLINICAL DATA:  Initial evaluation for gradually worsening right lower quadrant pain. EXAM: CT ABDOMEN AND PELVIS WITH CONTRAST TECHNIQUE: Multidetector CT imaging of the abdomen and pelvis was  performed using the standard protocol following bolus administration of intravenous contrast. CONTRAST:  ISOVUE-300 IOPAMIDOL (ISOVUE-300) INJECTION 61% COMPARISON:  None. FINDINGS: Minimal atelectatic changes present within the visualized lung bases. Visualized lungs are otherwise clear para Liver demonstrates a normal contrast enhanced appearance. Gallbladder within normal limits. No biliary dilatation. Spleen, adrenal glands, and pancreas demonstrate a normal contrast enhanced appearance. Kidneys are equal in size with symmetric enhancement. Kidneys are somewhat lobulated in contour bilaterally. No nephrolithiasis, hydronephrosis, or focal  enhancing renal mass. Stomach within normal limits. No evidence for bowel obstruction. Appendix well visualized in the right lower quadrant and is of normal caliber and appearance without associated inflammatory changes to suggest acute appendicitis. Terminal ileum within normal limits without acute abnormality. No abnormal wall thickening, mucosal enhancement, or inflammatory fat stranding seen about the bowels. Bladder within normal limits.  Uterus and ovaries normal. No free air or fluid. No pathologically enlarged intra-abdominal or pelvic lymph nodes. Normal intravascular enhancement seen throughout the intra-abdominal aorta and its branch vessels. Small fat containing paraumbilical hernia noted. No acute osseous abnormality. No worrisome lytic or blastic osseous lesions. IMPRESSION: 1. No CT evidence for acute intra-abdominal or pelvic process. 2. Normal appendix. Electronically Signed   By: Rise MuBenjamin  McClintock M.D.   On: 08/22/2015 23:40    EKG  EKG Interpretation None       Radiology No results found.  Procedures Procedures (including critical care time)  Medications Ordered in ED Medications - No data to display   Initial Impression / Assessment and Plan / ED Course  Cathren LaineKevin Ndia Sampath, MD has reviewed the triage vital signs and the nursing  notes.  Pertinent labs & imaging results that were available during my care of the patient were reviewed by me and considered in my medical decision making (see chart for details).  Clinical Course    Iv ns. Labs. Ct.   Reviewed nursing notes and prior charts for additional history.   Morphine iv. zofran iv.  Ct negative acute.  Recheck abd soft, no focal tenderness. No peritoneal signs.   No emesis in ED. Afeb. Tolerating po.  Patient currently appears stable for d/c.   Return precautions provided.     Final Clinical Impressions(s) / ED Diagnoses   Final diagnoses:  None   I personally performed the services described in this documentation, which was scribed in my presence. The recorded information has been reviewed and considered. Cathren LaineKevin Megon Kalina, MD   New Prescriptions New Prescriptions   No medications on file     Cathren LaineKevin Alaze Garverick, MD 08/22/15 [phone]

## 2015-08-22 NOTE — ED Triage Notes (Signed)
Patient complaining of right lower quadrant abdominal pain x 4 days. States she had vomiting for 2 days but that has resolved. Still complaining of nausea and constipation. States last normal bowel movement was 3-4 days ago.

## 2015-08-22 NOTE — ED Notes (Signed)
Pt request resources for outpatient care and MD locally, pt given local resources

## 2015-08-22 NOTE — ED Notes (Signed)
Patient transported to X-ray 

## 2015-08-23 NOTE — Discharge Instructions (Signed)
It was our pleasure to provide your ER care today - we hope that you feel better.  Your ct scan was read as showing no acute infection or other acute process.  Incidental note was made of a small hernia near the umbilicus.   Rest. Drink plenty of fluids. Take motrin or aleve as need for pain.  Follow up with primary care doctor this Monday for recheck if symptoms fail to improve/resolve.  Return to ER if worse, new symptoms, fevers, persistent vomiting, worsening or severe abdominal pain, other concern.  You were given pain medication in the ER - no driving for the next 4 hours.

## 2015-10-03 ENCOUNTER — Encounter (HOSPITAL_COMMUNITY): Payer: Self-pay | Admitting: *Deleted

## 2015-10-03 ENCOUNTER — Emergency Department (HOSPITAL_COMMUNITY): Payer: Self-pay

## 2015-10-03 ENCOUNTER — Emergency Department (HOSPITAL_COMMUNITY)
Admission: EM | Admit: 2015-10-03 | Discharge: 2015-10-03 | Disposition: A | Discharge status: Home / Self Care | Payer: Self-pay | Attending: Emergency Medicine | Admitting: Emergency Medicine

## 2015-10-03 DIAGNOSIS — R5383 Other fatigue: Secondary | ICD-10-CM | POA: Insufficient documentation

## 2015-10-03 DIAGNOSIS — F17200 Nicotine dependence, unspecified, uncomplicated: Secondary | ICD-10-CM | POA: Insufficient documentation

## 2015-10-03 DIAGNOSIS — Z79899 Other long term (current) drug therapy: Secondary | ICD-10-CM | POA: Insufficient documentation

## 2015-10-03 DIAGNOSIS — R1030 Lower abdominal pain, unspecified: Secondary | ICD-10-CM | POA: Insufficient documentation

## 2015-10-03 DIAGNOSIS — R110 Nausea: Secondary | ICD-10-CM | POA: Insufficient documentation

## 2015-10-03 LAB — WET PREP, GENITAL
Clue Cells Wet Prep HPF POC: NONE SEEN
Sperm: NONE SEEN
Trich, Wet Prep: NONE SEEN
YEAST WET PREP: NONE SEEN

## 2015-10-03 LAB — PREGNANCY, URINE: PREG TEST UR: NEGATIVE

## 2015-10-03 LAB — COMPREHENSIVE METABOLIC PANEL
ALT: 14 U/L (ref 14–54)
AST: 17 U/L (ref 15–41)
Albumin: 3.6 g/dL (ref 3.5–5.0)
Alkaline Phosphatase: 74 U/L (ref 38–126)
Anion gap: 6 (ref 5–15)
BUN: 9 mg/dL (ref 6–20)
CHLORIDE: 106 mmol/L (ref 101–111)
CO2: 21 mmol/L — ABNORMAL LOW (ref 22–32)
CREATININE: 0.67 mg/dL (ref 0.44–1.00)
Calcium: 8.2 mg/dL — ABNORMAL LOW (ref 8.9–10.3)
Glucose, Bld: 113 mg/dL — ABNORMAL HIGH (ref 65–99)
Potassium: 3.3 mmol/L — ABNORMAL LOW (ref 3.5–5.1)
Sodium: 133 mmol/L — ABNORMAL LOW (ref 135–145)
Total Bilirubin: 0.2 mg/dL — ABNORMAL LOW (ref 0.3–1.2)
Total Protein: 7.2 g/dL (ref 6.5–8.1)

## 2015-10-03 LAB — CBC
HCT: 36.3 % (ref 36.0–46.0)
Hemoglobin: 11.7 g/dL — ABNORMAL LOW (ref 12.0–15.0)
MCH: 25.3 pg — ABNORMAL LOW (ref 26.0–34.0)
MCHC: 32.2 g/dL (ref 30.0–36.0)
MCV: 78.6 fL (ref 78.0–100.0)
PLATELETS: 349 10*3/uL (ref 150–400)
RBC: 4.62 MIL/uL (ref 3.87–5.11)
RDW: 16.2 % — ABNORMAL HIGH (ref 11.5–15.5)
WBC: 19.7 10*3/uL — AB (ref 4.0–10.5)

## 2015-10-03 LAB — URINALYSIS, ROUTINE W REFLEX MICROSCOPIC
Bilirubin Urine: NEGATIVE
GLUCOSE, UA: NEGATIVE mg/dL
Hgb urine dipstick: NEGATIVE
Ketones, ur: NEGATIVE mg/dL
LEUKOCYTES UA: NEGATIVE
NITRITE: NEGATIVE
PROTEIN: NEGATIVE mg/dL
Specific Gravity, Urine: 1.015 (ref 1.005–1.030)
pH: 6.5 (ref 5.0–8.0)

## 2015-10-03 LAB — I-STAT CG4 LACTIC ACID, ED
LACTIC ACID, VENOUS: 2.38 mmol/L — AB (ref 0.5–1.9)
Lactic Acid, Venous: 0.8 mmol/L (ref 0.5–1.9)

## 2015-10-03 LAB — LIPASE, BLOOD: LIPASE: 22 U/L (ref 11–51)

## 2015-10-03 MED ORDER — SODIUM CHLORIDE 0.9 % IV BOLUS (SEPSIS)
1000.0000 mL | Freq: Once | INTRAVENOUS | Status: AC
  Administered 2015-10-03: 1000 mL via INTRAVENOUS

## 2015-10-03 MED ORDER — IOPAMIDOL (ISOVUE-300) INJECTION 61%
100.0000 mL | Freq: Once | INTRAVENOUS | Status: AC | PRN
  Administered 2015-10-03: 100 mL via INTRAVENOUS

## 2015-10-03 MED ORDER — ACETAMINOPHEN 500 MG PO TABS
1000.0000 mg | ORAL_TABLET | Freq: Once | ORAL | Status: AC
Start: 2015-10-03 — End: 2015-10-03
  Administered 2015-10-03: 1000 mg via ORAL
  Filled 2015-10-03: qty 2

## 2015-10-03 NOTE — Discharge Instructions (Addendum)
Take over the counter tylenol and ibuprofen, as directed on packaging, as needed for discomfort. Increase your fluid intake (ie: Gatorade) for the next few days. Your CT scan showed an incidental finding: "There is a small mass in each kidney. Further evaluation with pre and post contrast MRI nonemergently should be considered."  Call your regular medical doctor on Monday to schedule a follow up appointment next week for this finding.   Return to the Emergency Department immediately if worsening.

## 2015-10-03 NOTE — ED Provider Notes (Signed)
[facility]-EMERG[facility]Y DEPT Provider Note   CSN: [id number] Arrival date & time: 10/03/15  1410     History   Chief Complaint Chief Complaint  Patient presents with  . Fatigue    HPI Shannon Andrews is a 30 y.o. female.  HPI  Pt was seen at 1740. Per pt, c/o gradual onset and persistence of constant generalized body aches/fatigue that began when she woke up at noon today. Pt states she has had "chills," mild lower abd pain and nausea. Denies sore throat, no CP/palpitations, no SOB/cough, no vomiting/diarrhea, no rash.   History reviewed. No pertinent past medical history.  There are no active problems to display for this patient.   Past Surgical History:  Procedure Laterality Date  . TUBAL LIGATION         Home Medications    Prior to Admission medications   Not on File    Family History No family history on file.  Social History Social History  Substance Use Topics  . Smoking status: Current Every Day Smoker    Packs/day: 0.50  . Smokeless tobacco: Never Used  . Alcohol use No     Allergies   Amoxicillin and Penicillins   Review of Systems Review of Systems ROS: Statement: All systems negative except as marked or noted in the HPI; Constitutional: Negative for objective fever and +chills, generalized body aches/fatigue. ; ; Eyes: Negative for eye pain, redness and discharge. ; ; ENMT: Negative for ear pain, hoarseness, nasal congestion, sinus pressure and sore throat. ; ; Cardiovascular: Negative for chest pain, palpitations, diaphoresis, dyspnea and peripheral edema. ; ; Respiratory: Negative for cough, wheezing and stridor. ; ; Gastrointestinal: +nausea, abd pain. Negative for vomiting, diarrhea, blood in stool, hematemesis, jaundice and rectal bleeding. . ; ; Genitourinary: Negative for dysuria, flank pain and hematuria. ; ; GYN:  No pelvic pain, no vaginal bleeding, no vaginal discharge, no vulvar pain. ;; Musculoskeletal: Negative for back pain and neck pain.  Negative for swelling and trauma.; ; Skin: Negative for pruritus, rash, abrasions, blisters, bruising and skin lesion.; ; Neuro: Negative for headache, lightheadedness and neck stiffness. Negative for altered level of consciousness, altered mental status, extremity weakness, paresthesias, involuntary movement, seizure and syncope.       Physical Exam Updated Vital Signs BP 143/88   Pulse 110   Temp 99.7 F (37.6 C)   Resp 20   Ht 5\' 8"  (1.727 m)   Wt 220 lb (99.8 kg)   LMP 09/14/2015   SpO2 99%   BMI 33.45 kg/m    BP 122/72 (BP Location: Left Arm)   Pulse 88   Temp 97.7 F (36.5 C) (Oral)   Resp 16   Ht 5\' 8"  (1.727 m)   Wt 220 lb (99.8 kg)   LMP 09/14/2015   SpO2 98%   BMI 33.45 kg/m    Physical Exam 1745: Physical examination:  Nursing notes reviewed; Vital signs and O2 SAT reviewed;  Constitutional: Well developed, Well nourished, Well hydrated, In no acute distress; Head:  Normocephalic, atraumatic; Eyes: EOMI, PERRL, No scleral icterus; ENMT: Mouth and pharynx normal, Mucous membranes moist; Neck: Supple, No meningeal signs. Full range of motion, No lymphadenopathy; Cardiovascular: Regular rate and rhythm, No gallop; Respiratory: Breath sounds clear & equal bilaterally, No wheezes.  Speaking full sentences with ease, Normal respiratory effort/excursion; Chest: Nontender, Movement normal; Abdomen: Soft, +suprapubic tenderness to palp. No rebound or guarding. Nondistended, Normal bowel sounds; Genitourinary: No CVA tenderness. Pelvic exam performed with permission of  pt and female ED tech assist during exam.  External genitalia w/o lesions. Vaginal vault with scant white discharge.  Cervix w/o lesions, not friable, GC/chlam and wet prep obtained and sent to lab.  Bimanual exam w/o CMT or adnexal tenderness. +suprapubic tenderness.; Extremities: Pulses normal, No tenderness, No edema, No calf edema or asymmetry.; Neuro: AA&Ox3, Major CN grossly intact.  Speech clear. No gross  focal motor or sensory deficits in extremities.; Skin: Color normal, Warm, Dry.   ED Treatments / Results  Labs (all labs ordered are listed, but only abnormal results are displayed)   EKG  EKG Interpretation None       Radiology   Procedures Procedures (including critical care time)  Medications Ordered in ED Medications  sodium chloride 0.9 % bolus 1,000 mL (not administered)  acetaminophen (TYLENOL) tablet 1,000 mg (not administered)     Initial Impression / Assessment and Plan / ED Course  I have reviewed the triage vital signs and the nursing notes.  Pertinent labs & imaging results that were available during my care of the patient were reviewed by me and considered in my medical decision making (see chart for details).  MDM Reviewed: previous chart, nursing note and vitals Reviewed previous: labs Interpretation: labs, x-ray and CT scan   Results for orders placed or performed during the hospital encounter of 10/03/15  Wet prep, genital  Result Value Ref Range   Yeast Wet Prep HPF POC NONE SEEN NONE SEEN   Trich, Wet Prep NONE SEEN NONE SEEN   Clue Cells Wet Prep HPF POC NONE SEEN NONE SEEN   WBC, Wet Prep HPF POC FEW (A) NONE SEEN   Sperm NONE SEEN   Lipase, blood  Result Value Ref Range   Lipase 22 11 - 51 U/L  Comprehensive metabolic panel  Result Value Ref Range   Sodium 133 (L) 135 - 145 mmol/L   Potassium 3.3 (L) 3.5 - 5.1 mmol/L   Chloride 106 101 - 111 mmol/L   CO2 21 (L) 22 - 32 mmol/L   Glucose, Bld 113 (H) 65 - 99 mg/dL   BUN 9 6 - 20 mg/dL   Creatinine, Ser 1.610.67 0.44 - 1.00 mg/dL   Calcium 8.2 (L) 8.9 - 10.3 mg/dL   Total Protein 7.2 6.5 - 8.1 g/dL   Albumin 3.6 3.5 - 5.0 g/dL   AST 17 15 - 41 U/L   ALT 14 14 - 54 U/L   Alkaline Phosphatase 74 38 - 126 U/L   Total Bilirubin 0.2 (L) 0.3 - 1.2 mg/dL   GFR calc non Af Amer >60 >60 mL/min   GFR calc Af Amer >60 >60 mL/min   Anion gap 6 5 - 15  CBC  Result Value Ref Range   WBC 19.7  (H) 4.0 - 10.5 K/uL   RBC 4.62 3.87 - 5.11 MIL/uL   Hemoglobin 11.7 (L) 12.0 - 15.0 g/dL   HCT 09.636.3 04.536.0 - 40.946.0 %   MCV 78.6 78.0 - 100.0 fL   MCH 25.3 (L) 26.0 - 34.0 pg   MCHC 32.2 30.0 - 36.0 g/dL   RDW 81.116.2 (H) 91.411.5 - 78.215.5 %   Platelets 349 150 - 400 K/uL  Urinalysis, Routine w reflex microscopic  Result Value Ref Range   Color, Urine YELLOW YELLOW   APPearance CLEAR CLEAR   Specific Gravity, Urine 1.015 1.005 - 1.030   pH 6.5 5.0 - 8.0   Glucose, UA NEGATIVE NEGATIVE mg/dL   Hgb urine dipstick NEGATIVE  NEGATIVE   Bilirubin Urine NEGATIVE NEGATIVE   Ketones, ur NEGATIVE NEGATIVE mg/dL   Protein, ur NEGATIVE NEGATIVE mg/dL   Nitrite NEGATIVE NEGATIVE   Leukocytes, UA NEGATIVE NEGATIVE  Pregnancy, urine  Result Value Ref Range   Preg Test, Ur NEGATIVE NEGATIVE  I-Stat CG4 Lactic Acid, ED  Result Value Ref Range   Lactic Acid, Venous 2.38 (HH) 0.5 - 1.9 mmol/L   Comment NOTIFIED PHYSICIAN   I-Stat CG4 Lactic Acid, ED  Result Value Ref Range   Lactic Acid, Venous 0.80 0.5 - 1.9 mmol/L   Dg Chest 2 View Result Date: 10/03/2015 CLINICAL DATA:  30 y/o F; weakness, chills, congestion, lower abdominal pain, and nausea with onset today. EXAM: CHEST  2 VIEW COMPARISON:  None. FINDINGS: The heart size and mediastinal contours are within normal limits and stable. Both lungs are clear. The visualized skeletal structures are unremarkable. IMPRESSION: No active cardiopulmonary disease. Electronically Signed   By: Mitzi Hansen M.D.   On: 10/03/2015 18:08   Ct Abdomen Pelvis W Contrast Result Date: 10/03/2015 CLINICAL DATA:  Abdominal pain and generalized weakness EXAM: CT ABDOMEN AND PELVIS WITH CONTRAST TECHNIQUE: Multidetector CT imaging of the abdomen and pelvis was performed using the standard protocol following bolus administration of intravenous contrast. CONTRAST:  ISOVUE-300 IOPAMIDOL (ISOVUE-300) INJECTION 61% COMPARISON:  August 22, 2015 FINDINGS: Lower chest: Lung  bases are clear. Hepatobiliary: Liver measures 21.8 cm in length. No focal liver lesions are evident. Gallbladder wall is not appreciably thickened. There is no biliary duct dilatation. Pancreas: No pancreatic mass or inflammatory focus. Spleen: No splenic lesions are evident. Adrenals/Urinary Tract: Adrenals bilaterally appear normal. There is a 7 x 7 mm cyst in the mid left kidney. There is a cyst there is a 1 x 1 cm mass arising from the posterior upper to mid right kidney which has attenuation higher than is expected with a cyst. There is a mass arising from the upper pole of the left kidney measuring 1.3 x 1.4 cm which also has attenuation values higher than is expected with a cyst. There is no hydronephrosis on either side. There is no renal or ureteral calculus on either side. Urinary bladder is midline with wall thickness within normal limits. Stomach/Bowel: There is no appreciable bowel wall or mesenteric thickening. There is no appreciable bowel obstruction. No free air or portal venous air. Vascular/Lymphatic: There is no abdominal aortic aneurysm. No vascular lesions are evident. There is no adenopathy in the abdomen or pelvis. Reproductive: The uterus appears grossly normal in size and contour. There is no pelvic mass. There is a minimal amount of free fluid in the cul-de-sac. Other: Appendix appears unremarkable. There is no ascites beyond minimal free fluid in the cul-de-sac or abscess in the abdomen or pelvis. There is a small ventral hernia containing only fat. Musculoskeletal: There are no blastic or lytic bone lesions. There is no abdominal wall or intramuscular lesion. IMPRESSION: There is a small mass in each kidney as noted above which has attenuation values higher than is expected with a simple cyst. Further evaluation with pre and post contrast MRI nonemergently should be considered. Pre and post contrast CT could alternatively be performed, but would likely be of decreased accuracy given  lesion size. No hydronephrosis.  No renal or ureteral calculus. Prominent liver without focal lesion. No bowel obstruction. No abscess. Appendix region appears normal. Minimal amount of free fluid in the cul-de-sac may be physiologic. Small ventral hernia containing only fat. Electronically Signed  By: Bretta Bang III M.D.   On: 10/03/2015 19:20    2145:  Lactic acid improved after IVF. WBC count elevated, but extensive workup without clear source of infection. Pt remains afebrile with stable VS. Has tol PO well while in the ED without N/V. Will tx symptomatically at this time. Pt states she is ready to go home now. Dx and testing d/w pt and family.  Questions answered.  Verb understanding, agreeable to d/c home with outpt f/u.    Final Clinical Impressions(s) / ED Diagnoses   Final diagnoses:  None    New Prescriptions New Prescriptions   No medications on file     Samuel Jester, DO 10/06/15 1731

## 2015-10-03 NOTE — ED Triage Notes (Signed)
Pt comes in with generalized weakness starting this morning. Pt states she doesn't feel like herself. Denies any diarrhea or vomiting. Pt has some nausea. Denies any urinary problems.    At end of triage, pt adds she is having intermittent lower abdominal pain.

## 2015-10-06 LAB — GC/CHLAMYDIA PROBE AMP (~~LOC~~) NOT AT ARMC
Chlamydia: NEGATIVE
Neisseria Gonorrhea: NEGATIVE

## 2016-08-31 ENCOUNTER — Emergency Department (HOSPITAL_COMMUNITY)
Admission: EM | Admit: 2016-08-31 | Discharge: 2016-08-31 | Disposition: A | Discharge status: Home / Self Care | Payer: Medicaid Other | Attending: Emergency Medicine | Admitting: Emergency Medicine

## 2016-08-31 ENCOUNTER — Encounter (HOSPITAL_COMMUNITY): Payer: Self-pay | Admitting: Cardiology

## 2016-08-31 DIAGNOSIS — R197 Diarrhea, unspecified: Secondary | ICD-10-CM | POA: Diagnosis not present

## 2016-08-31 DIAGNOSIS — R1110 Vomiting, unspecified: Secondary | ICD-10-CM | POA: Diagnosis present

## 2016-08-31 DIAGNOSIS — Z79899 Other long term (current) drug therapy: Secondary | ICD-10-CM | POA: Insufficient documentation

## 2016-08-31 DIAGNOSIS — R112 Nausea with vomiting, unspecified: Secondary | ICD-10-CM | POA: Insufficient documentation

## 2016-08-31 DIAGNOSIS — R109 Unspecified abdominal pain: Secondary | ICD-10-CM | POA: Diagnosis not present

## 2016-08-31 DIAGNOSIS — F17200 Nicotine dependence, unspecified, uncomplicated: Secondary | ICD-10-CM | POA: Diagnosis not present

## 2016-08-31 LAB — COMPREHENSIVE METABOLIC PANEL
ALBUMIN: 4.3 g/dL (ref 3.5–5.0)
ALT: 22 U/L (ref 14–54)
ANION GAP: 10 (ref 5–15)
AST: 22 U/L (ref 15–41)
Alkaline Phosphatase: 82 U/L (ref 38–126)
BILIRUBIN TOTAL: 0.3 mg/dL (ref 0.3–1.2)
BUN: 9 mg/dL (ref 6–20)
CHLORIDE: 106 mmol/L (ref 101–111)
CO2: 24 mmol/L (ref 22–32)
Calcium: 8.7 mg/dL — ABNORMAL LOW (ref 8.9–10.3)
Creatinine, Ser: 0.81 mg/dL (ref 0.44–1.00)
GFR calc Af Amer: >60 mL/min (ref >60)
GLUCOSE: 150 mg/dL — AB (ref 65–99)
POTASSIUM: 2.9 mmol/L — AB (ref 3.5–5.1)
Sodium: 140 mmol/L (ref 135–145)
TOTAL PROTEIN: 8.5 g/dL — AB (ref 6.5–8.1)

## 2016-08-31 LAB — CBC WITH DIFFERENTIAL/PLATELET
BASOS ABS: 0 10*3/uL (ref 0.0–0.1)
BASOS PCT: 0 %
EOS PCT: 0 %
Eosinophils Absolute: 0 10*3/uL (ref 0.0–0.7)
HEMATOCRIT: 40.3 % (ref 36.0–46.0)
Hemoglobin: 13.3 g/dL (ref 12.0–15.0)
Lymphocytes Relative: 8 %
Lymphs Abs: 1.4 10*3/uL (ref 0.7–4.0)
MCH: 25 pg — ABNORMAL LOW (ref 26.0–34.0)
MCHC: 33 g/dL (ref 30.0–36.0)
MCV: 75.9 fL — ABNORMAL LOW (ref 78.0–100.0)
MONO ABS: 0.4 10*3/uL (ref 0.1–1.0)
MONOS PCT: 2 %
NEUTROS ABS: 14.6 10*3/uL — AB (ref 1.7–7.7)
Neutrophils Relative %: 90 %
PLATELETS: 447 10*3/uL — AB (ref 150–400)
RBC: 5.31 MIL/uL — ABNORMAL HIGH (ref 3.87–5.11)
RDW: 15.5 % (ref 11.5–15.5)
WBC: 16.4 10*3/uL — ABNORMAL HIGH (ref 4.0–10.5)

## 2016-08-31 LAB — LIPASE, BLOOD: LIPASE: 29 U/L (ref 11–51)

## 2016-08-31 LAB — I-STAT BETA HCG BLOOD, ED (MC, WL, AP ONLY)

## 2016-08-31 MED ORDER — ONDANSETRON 4 MG PO TBDP: ORAL_TABLET | ORAL | 0 refills | Status: DC

## 2016-08-31 MED ORDER — PANTOPRAZOLE SODIUM 40 MG IV SOLR
40.0000 mg | Freq: Once | INTRAVENOUS | Status: AC
  Administered 2016-08-31: 40 mg via INTRAVENOUS
  Filled 2016-08-31: qty 40

## 2016-08-31 MED ORDER — KETOROLAC TROMETHAMINE 30 MG/ML IJ SOLN
30.0000 mg | Freq: Once | INTRAMUSCULAR | Status: AC
Start: 2016-08-31 — End: 2016-08-31
  Administered 2016-08-31: 30 mg via INTRAVENOUS
  Filled 2016-08-31: qty 1

## 2016-08-31 MED ORDER — SODIUM CHLORIDE 0.9 % IV BOLUS (SEPSIS)
2000.0000 mL | Freq: Once | INTRAVENOUS | Status: AC
  Administered 2016-08-31: 2000 mL via INTRAVENOUS

## 2016-08-31 MED ORDER — ONDANSETRON HCL 4 MG/2ML IJ SOLN
4.0000 mg | Freq: Once | INTRAMUSCULAR | Status: AC
  Administered 2016-08-31: 4 mg via INTRAVENOUS
  Filled 2016-08-31: qty 2

## 2016-08-31 MED ORDER — POTASSIUM CHLORIDE CRYS ER 20 MEQ PO TBCR
40.0000 meq | EXTENDED_RELEASE_TABLET | Freq: Once | ORAL | Status: AC
  Administered 2016-08-31: 40 meq via ORAL
  Filled 2016-08-31: qty 2

## 2016-08-31 NOTE — ED Provider Notes (Signed)
[facility] Provider Note   CSN: [id number] Arrival date & time: 08/31/16  7829     History   Chief Complaint Chief Complaint  Patient presents with  . Emesis    HPI Shannon Andrews is a 31 y.o. female.    Patient complains of vomiting diarrhea and abdominal cramps for 2 days now no blood in her vomit or diarrhea   The history is provided by the patient.  Emesis   This is a new problem. The current episode started 2 days ago. The problem occurs 5 to 10 times per day. The problem has not changed since onset.The emesis has an appearance of stomach contents. There has been no fever. Associated symptoms include abdominal pain. Pertinent negatives include no chills, no cough, no diarrhea and no headaches.    History reviewed. No pertinent past medical history.  There are no active problems to display for this patient.   Past Surgical History:  Procedure Laterality Date  . TUBAL LIGATION      OB History    No data available       Home Medications    Prior to Admission medications   Medication Sig Start Date End Date Taking? Authorizing Provider  ADDERALL XR 25 MG 24 hr capsule Take by mouth every morning. 08/23/16  Yes [provider]  busPIRone (BUSPAR) 10 MG tablet Take 10 mg by mouth 2 (two) times daily.   Yes [provider]  losartan (COZAAR) 50 MG tablet Take 50 mg by mouth daily. 06/29/16  Yes [provider]  traMADol (ULTRAM) 50 MG tablet Take 50 mg by mouth every 8 (eight) hours as needed for muscle pain. 08/23/16  Yes [provider]  ondansetron (ZOFRAN ODT) 4 MG disintegrating tablet 4mg  ODT q4 hours prn nausea/vomit 08/31/16   Bethann Berkshire, MD    Family History History reviewed. No pertinent family history.  Social History Social History  Substance Use Topics  . Smoking status: Current Every Day Smoker    Packs/day: 0.50  . Smokeless tobacco: Never Used  . Alcohol use No     Allergies   Amoxicillin and  Penicillins   Review of Systems Review of Systems  Constitutional: Negative for appetite change, chills and fatigue.  HENT: Negative for congestion, ear discharge and sinus pressure.   Eyes: Negative for discharge.  Respiratory: Negative for cough.   Cardiovascular: Negative for chest pain.  Gastrointestinal: Positive for abdominal pain and vomiting. Negative for diarrhea.  Genitourinary: Negative for frequency and hematuria.  Musculoskeletal: Negative for back pain.  Skin: Negative for rash.  Neurological: Negative for seizures and headaches.  Psychiatric/Behavioral: Negative for hallucinations.     Physical Exam Updated Vital Signs BP (!) 126/95   Pulse 94   Temp 97.8 F (36.6 C) (Oral)   Resp 16   Ht 5\' 6"  (1.676 m)   Wt 99.8 kg (220 lb)   LMP 08/29/2016   SpO2 98%   BMI 35.51 kg/m   Physical Exam  Constitutional: She is oriented to person, place, and time. She appears well-developed.  HENT:  Head: Normocephalic.  Eyes: Conjunctivae and EOM are normal. No scleral icterus.  Neck: Neck supple. No thyromegaly present.  Cardiovascular: Normal rate and regular rhythm.  Exam reveals no gallop and no friction rub.   No murmur heard. Pulmonary/Chest: No stridor. She has no wheezes. She has no rales. She exhibits no tenderness.  Abdominal: She exhibits no distension. There is no tenderness. There is no rebound.  Musculoskeletal: Normal range of motion. She exhibits no edema.  Lymphadenopathy:    She has no cervical adenopathy.  Neurological: She is oriented to person, place, and time. She exhibits normal muscle tone. Coordination normal.  Skin: No rash noted. No erythema.  Psychiatric: She has a normal mood and affect. Her behavior is normal.     ED Treatments / Results  Labs (all labs ordered are listed, but only abnormal results are displayed) Labs Reviewed  CBC WITH DIFFERENTIAL/PLATELET - Abnormal; Notable for the following:       Result Value   WBC 16.4 (*)      RBC 5.31 (*)    MCV 75.9 (*)    MCH 25.0 (*)    Platelets 447 (*)    Neutro Abs 14.6 (*)    All other components within normal limits  COMPREHENSIVE METABOLIC PANEL - Abnormal; Notable for the following:    Potassium 2.9 (*)    Glucose, Bld 150 (*)    Calcium 8.7 (*)    Total Protein 8.5 (*)    All other components within normal limits  LIPASE, BLOOD  I-STAT BETA HCG BLOOD, ED ([facility], [facility], [facility] ONLY)    EKG  EKG Interpretation None       Radiology No results found.  Procedures Procedures (including critical care time)  Medications Ordered in ED Medications  potassium chloride SA (K-DUR,KLOR-CON) CR tablet 40 mEq (not administered)  sodium chloride 0.9 % bolus 2,000 mL (2,000 mLs Intravenous New Bag/Given 08/31/16 1105)  ondansetron (ZOFRAN) injection 4 mg (4 mg Intravenous Given 08/31/16 1105)  ketorolac (TORADOL) 30 MG/ML injection 30 mg (30 mg Intravenous Given 08/31/16 1105)  pantoprazole (PROTONIX) injection 40 mg (40 mg Intravenous Given 08/31/16 1106)     Initial Impression / Assessment and Plan / ED Course  I have reviewed the triage vital signs and the nursing notes.  Pertinent labs & imaging results that were available during my care of the patient were reviewed by me and considered in my medical decision making (see chart for details).     Patient with gastroenteritis. She improved with fluids and nausea medicine and Toradol. Patient will be sent home on Zofran will follow-up as needed  Final Clinical Impressions(s) / ED Diagnoses   Final diagnoses:  Nausea vomiting and diarrhea    New Prescriptions New Prescriptions   ONDANSETRON (ZOFRAN ODT) 4 MG DISINTEGRATING TABLET    4mg  ODT q4 hours prn nausea/vomit     Bethann Berkshire, MD 08/31/16 [phone]

## 2016-08-31 NOTE — ED Triage Notes (Signed)
Vomiting,  Diarrhea and abdominal cramping since last night

## 2016-08-31 NOTE — Discharge Instructions (Signed)
Drink plenty of  fluids.  Tylenol or motrin for pain.  Follow up if not improving

## 2016-12-03 ENCOUNTER — Emergency Department (HOSPITAL_COMMUNITY)
Admission: EM | Admit: 2016-12-03 | Discharge: 2016-12-03 | Disposition: A | Discharge status: Home / Self Care | Payer: Medicaid Other | Attending: Emergency Medicine | Admitting: Emergency Medicine

## 2016-12-03 ENCOUNTER — Encounter (HOSPITAL_COMMUNITY): Payer: Self-pay | Admitting: Emergency Medicine

## 2016-12-03 DIAGNOSIS — K0889 Other specified disorders of teeth and supporting structures: Secondary | ICD-10-CM | POA: Diagnosis present

## 2016-12-03 DIAGNOSIS — F17200 Nicotine dependence, unspecified, uncomplicated: Secondary | ICD-10-CM | POA: Insufficient documentation

## 2016-12-03 DIAGNOSIS — Z79899 Other long term (current) drug therapy: Secondary | ICD-10-CM | POA: Diagnosis not present

## 2016-12-03 DIAGNOSIS — R030 Elevated blood-pressure reading, without diagnosis of hypertension: Secondary | ICD-10-CM | POA: Diagnosis not present

## 2016-12-03 DIAGNOSIS — K047 Periapical abscess without sinus: Secondary | ICD-10-CM | POA: Diagnosis not present

## 2016-12-03 MED ORDER — HYDROCODONE-ACETAMINOPHEN 5-325 MG PO TABS
2.0000 | ORAL_TABLET | Freq: Once | ORAL | Status: AC
  Administered 2016-12-03: 2 via ORAL
  Filled 2016-12-03: qty 2

## 2016-12-03 MED ORDER — HYDROCODONE-ACETAMINOPHEN 5-325 MG PO TABS: 1.0000 | ORAL_TABLET | ORAL | 0 refills | Status: DC | PRN

## 2016-12-03 NOTE — ED Triage Notes (Signed)
Patient complaining of dental pain and abscess starting yesterday. States she was recently on antibiotics for dental abscess.

## 2016-12-03 NOTE — Discharge Instructions (Signed)
Continue with antibiotics your dentist prescribed.  Now that this abscess site is draining your pain should improve.  You may use the hydrocodone prescribed for pain relief, do not drive within 4 hours of taking this medication as it will make you drowsy.

## 2016-12-03 NOTE — ED Notes (Signed)
Swollen face below the nose and left side of the mouth. Pt states abscess on upper gums was first noticed yesterday morning. Pt is taking antibiotics at home (Clindamycin 300mg ) prescribed by dentist for facial swelling and redness. Pt allergic to PCN and Amoxicillin.

## 2016-12-03 NOTE — ED Provider Notes (Signed)
[facility]GENCY DEPARTMENT Provider Note   CSN: [id number] Arrival date & time: 12/03/16  1131     History   Chief Complaint Chief Complaint  Patient presents with  . Dental Pain    HPI Shannon Andrews is a 31 y.o. female presenting with a  5 day history of dental pain and gingival swelling.  She was seen by her dentist and eating 4 days ago at which time she was placed on clindamycin for dental infection in anticipation of dental extraction took her in 2 weeks.  Since that time the swelling and facial pain has improved but she has localized and worsening pain and swelling of her gingiva above the affected tooth.  There has been no fevers, chills, nausea or vomiting, also no complaint of difficulty swallowing, although chewing makes pain worse.  She is currently on day 4 of clindamycin.  She is taking ibuprofen which is not relieving her pain. .  The history is provided by the patient.    History reviewed. No pertinent past medical history.  There are no active problems to display for this patient.   Past Surgical History:  Procedure Laterality Date  . TUBAL LIGATION      OB History    No data available       Home Medications    Prior to Admission medications   Medication Sig Start Date End Date Taking? Authorizing Provider  ADDERALL XR 25 MG 24 hr capsule Take by mouth every morning. 08/23/16   [provider]  busPIRone (BUSPAR) 10 MG tablet Take 10 mg by mouth 2 (two) times daily.    [provider]  HYDROcodone-acetaminophen (NORCO/VICODIN) 5-325 MG tablet Take 1 tablet by mouth every 4 (four) hours as needed. 12/03/16   Burgess AmorIdol, Marselino Slayton, PA-C  losartan (COZAAR) 50 MG tablet Take 50 mg by mouth daily. 06/29/16   [provider]  ondansetron (ZOFRAN ODT) 4 MG disintegrating tablet 4mg  ODT q4 hours prn nausea/vomit 08/31/16   Bethann BerkshireZammit, Joseph, MD  traMADol (ULTRAM) 50 MG tablet Take 50 mg by mouth every 8 (eight) hours as needed for muscle pain.  08/23/16   [provider]    Family History History reviewed. No pertinent family history.  Social History Social History   Tobacco Use  . Smoking status: Current Every Day Smoker    Packs/day: 0.50  . Smokeless tobacco: Never Used  Substance Use Topics  . Alcohol use: No  . Drug use: No     Allergies   Amoxicillin and Penicillins   Review of Systems Review of Systems  Constitutional: Negative for chills and fever.  HENT: Positive for dental problem and facial swelling.   Respiratory: Negative for shortness of breath and wheezing.   Skin: Positive for rash.  Neurological: Negative for numbness.     Physical Exam Updated Vital Signs BP (!) 142/77 (BP Location: Right Arm)   Pulse 61   Temp (!) 97.5 F (36.4 C) (Oral)   Resp 18   Ht 5\' 7"  (1.702 m)   Wt 99.8 kg (220 lb)   LMP 11/19/2016   SpO2 100%   BMI 34.46 kg/m   Physical Exam  Constitutional: She is oriented to person, place, and time. She appears well-developed and well-nourished. No distress.  HENT:  Head: Normocephalic and atraumatic.  Right Ear: Tympanic membrane and external ear normal.  Left Ear: Tympanic membrane and external ear normal.  Nose: Nose normal.  Mouth/Throat: Oropharynx is clear and moist and mucous membranes  are normal. No oral lesions. No trismus in the jaw. Dental abscesses present.    Small draining gingival abscess along the left upper central incisor gingival edge.  Gentle pressure applied with continued drainage from the site.  Mild soft edema left upper lip.  There is no induration of her face.  No erythema.  Eyes: Conjunctivae are normal.  Neck: Normal range of motion. Neck supple.  Cardiovascular: Normal rate and normal heart sounds.  Pulmonary/Chest: Effort normal.  Abdominal: She exhibits no distension.  Musculoskeletal: Normal range of motion.  Lymphadenopathy:    She has no cervical adenopathy.  Neurological: She is alert and oriented to person, place, and  time.  Skin: Skin is warm and dry. No erythema.  Psychiatric: She has a normal mood and affect.     ED Treatments / Results  Labs (all labs ordered are listed, but only abnormal results are displayed) Labs Reviewed - No data to display  EKG  EKG Interpretation None       Radiology No results found.  Procedures Procedures (including critical care time)  Medications Ordered in ED Medications  HYDROcodone-acetaminophen (NORCO/VICODIN) 5-325 MG per tablet 2 tablet (2 tablets Oral Given 12/03/16 1426)     Initial Impression / Assessment and Plan / ED Course  I have reviewed the triage vital signs and the nursing notes.  Pertinent labs & imaging results that were available during my care of the patient were reviewed by me and considered in my medical decision making (see chart for details).     Patient currently on clindamycin with improved facial pain and tenderness, and now improving gingival pain.  Her abscess apparently started draining during this ED visit as it was not draining prior to arrival here.  She was encouraged to continue her clindamycin.  Discussed hydrogen peroxide swish and spit treatments, gentle massage to the site to continue drainage from this abscess site.  She was placed on hydrocodone for better pain relief.  Prior to discharge she endorsed that her pain was improved now that the site has drained.  She was advised to follow-up with her dentist, she currently has a follow-up appointment in 2 weeks, discussed closer follow-up if her symptoms persist or worsen.  Final Clinical Impressions(s) / ED Diagnoses   Final diagnoses:  Dental abscess  Elevated blood pressure reading    ED Discharge Orders        Ordered    HYDROcodone-acetaminophen (NORCO/VICODIN) 5-325 MG tablet  Every 4 hours PRN     12/03/16 1440       Burgess Amordol, Hisako Bugh, PA-C 12/03/16 1502    Lavera GuiseLiu, Dana Duo, MD 12/04/16 [phone]

## 2016-12-03 NOTE — ED Notes (Signed)
Pt's family states the Pt has not been herself since this pain has set in, and Pt has been having chills at home accompanied with hot, clammy skin. Pt's family also states the Pt's "balance has been off" at home and she is not acting like herself.

## 2017-12-13 ENCOUNTER — Emergency Department (HOSPITAL_COMMUNITY)
Admission: EM | Admit: 2017-12-13 | Discharge: 2017-12-13 | Disposition: A | Discharge status: Home / Self Care | Payer: Medicaid Other | Attending: Emergency Medicine | Admitting: Emergency Medicine

## 2017-12-13 ENCOUNTER — Emergency Department (HOSPITAL_COMMUNITY): Payer: Medicaid Other

## 2017-12-13 ENCOUNTER — Other Ambulatory Visit: Payer: Self-pay

## 2017-12-13 ENCOUNTER — Encounter (HOSPITAL_COMMUNITY): Payer: Self-pay | Admitting: Emergency Medicine

## 2017-12-13 DIAGNOSIS — F17210 Nicotine dependence, cigarettes, uncomplicated: Secondary | ICD-10-CM | POA: Diagnosis not present

## 2017-12-13 DIAGNOSIS — R112 Nausea with vomiting, unspecified: Secondary | ICD-10-CM | POA: Diagnosis present

## 2017-12-13 DIAGNOSIS — Z79899 Other long term (current) drug therapy: Secondary | ICD-10-CM | POA: Insufficient documentation

## 2017-12-13 DIAGNOSIS — R1010 Upper abdominal pain, unspecified: Secondary | ICD-10-CM | POA: Insufficient documentation

## 2017-12-13 LAB — URINALYSIS, ROUTINE W REFLEX MICROSCOPIC

## 2017-12-13 LAB — COMPREHENSIVE METABOLIC PANEL
ALBUMIN: 4 g/dL (ref 3.5–5.0)
ALK PHOS: 77 U/L (ref 38–126)
ALT: 17 U/L (ref 0–44)
AST: 19 U/L (ref 15–41)
Anion gap: 6 (ref 5–15)
BILIRUBIN TOTAL: 0.1 mg/dL — AB (ref 0.3–1.2)
BUN: 9 mg/dL (ref 6–20)
CALCIUM: 8.2 mg/dL — AB (ref 8.9–10.3)
CO2: 25 mmol/L (ref 22–32)
Chloride: 109 mmol/L (ref 98–111)
Creatinine, Ser: 0.69 mg/dL (ref 0.44–1.00)
GFR calc Af Amer: >60 mL/min (ref >60)
GFR calc non Af Amer: >60 mL/min (ref >60)
GLUCOSE: 157 mg/dL — AB (ref 70–99)
Potassium: 3.2 mmol/L — ABNORMAL LOW (ref 3.5–5.1)
SODIUM: 140 mmol/L (ref 135–145)
Total Protein: 7.9 g/dL (ref 6.5–8.1)

## 2017-12-13 LAB — CBC WITH DIFFERENTIAL/PLATELET
Abs Immature Granulocytes: 0.07 10*3/uL (ref 0.00–0.07)
BASOS ABS: 0.1 10*3/uL (ref 0.0–0.1)
Basophils Relative: 0 %
EOS PCT: 0 %
Eosinophils Absolute: 0 10*3/uL (ref 0.0–0.5)
HEMATOCRIT: 40.7 % (ref 36.0–46.0)
HEMOGLOBIN: 12.4 g/dL (ref 12.0–15.0)
IMMATURE GRANULOCYTES: 1 %
LYMPHS ABS: 1.3 10*3/uL (ref 0.7–4.0)
LYMPHS PCT: 12 %
MCH: 24.2 pg — ABNORMAL LOW (ref 26.0–34.0)
MCHC: 30.5 g/dL (ref 30.0–36.0)
MCV: 79.3 fL — AB (ref 80.0–100.0)
MONOS PCT: 4 %
Monocytes Absolute: 0.5 10*3/uL (ref 0.1–1.0)
Neutro Abs: 9.5 10*3/uL — ABNORMAL HIGH (ref 1.7–7.7)
Neutrophils Relative %: 83 %
Platelets: 435 10*3/uL — ABNORMAL HIGH (ref 150–400)
RBC: 5.13 MIL/uL — ABNORMAL HIGH (ref 3.87–5.11)
RDW: 15.6 % — ABNORMAL HIGH (ref 11.5–15.5)
WBC: 11.4 10*3/uL — ABNORMAL HIGH (ref 4.0–10.5)
nRBC: 0 % (ref 0.0–0.2)

## 2017-12-13 LAB — PREGNANCY, URINE: Preg Test, Ur: NEGATIVE

## 2017-12-13 LAB — URINALYSIS, MICROSCOPIC (REFLEX): RBC / HPF: >50 RBC/hpf (ref 0–5)

## 2017-12-13 LAB — LIPASE, BLOOD: Lipase: 33 U/L (ref 11–51)

## 2017-12-13 MED ORDER — ONDANSETRON HCL 4 MG/2ML IJ SOLN
4.0000 mg | INTRAMUSCULAR | Status: DC | PRN
  Administered 2017-12-13: 4 mg via INTRAVENOUS
  Filled 2017-12-13: qty 2

## 2017-12-13 MED ORDER — POTASSIUM CHLORIDE CRYS ER 20 MEQ PO TBCR
40.0000 meq | EXTENDED_RELEASE_TABLET | Freq: Once | ORAL | Status: AC
  Administered 2017-12-13: 40 meq via ORAL
  Filled 2017-12-13: qty 2

## 2017-12-13 MED ORDER — SODIUM CHLORIDE 0.9 % IV BOLUS
1000.0000 mL | Freq: Once | INTRAVENOUS | Status: AC
  Administered 2017-12-13: 1000 mL via INTRAVENOUS

## 2017-12-13 MED ORDER — FAMOTIDINE IN NACL 20-0.9 MG/50ML-% IV SOLN
20.0000 mg | Freq: Once | INTRAVENOUS | Status: AC
  Administered 2017-12-13: 20 mg via INTRAVENOUS
  Filled 2017-12-13: qty 50

## 2017-12-13 MED ORDER — ONDANSETRON 4 MG PO TBDP: 4.0000 mg | ORAL_TABLET | Freq: Three times a day (TID) | ORAL | 0 refills | Status: AC | PRN

## 2017-12-13 NOTE — ED Notes (Signed)
Advised patient we needed urine specimen. 

## 2017-12-13 NOTE — ED Notes (Signed)
Patient transported to Ultrasound 

## 2017-12-13 NOTE — ED Triage Notes (Signed)
Onset last night after midnight vomiting multiple times. Some abdominal pain.

## 2017-12-13 NOTE — Discharge Instructions (Addendum)
Take the prescription as directed.  Increase your fluid intake (ie:  Gatoraide) for the next few days, as discussed.  Eat a bland diet and advance to your regular diet slowly as you can tolerate it. Call your regular medical doctor today to schedule a follow up appointment this week.  Return to the Emergency Department immediately sooner if worsening. ° °

## 2017-12-13 NOTE — ED Provider Notes (Signed)
[facility] EMERGENCY DEPARTMENT Provider Note   CSN: [id number] Arrival date & time: 12/13/17  1032     History   Chief Complaint Chief Complaint  Patient presents with  . Emesis    HPI Shannon Andrews is a 32 y.o. female.  HPI  Pt was seen at 1145. Per pt, c/o gradual onset and persistence of multiple intermittent episodes of N/V that began overnight last night. Has been associated with upper abd "aching" pain. Pt has been unable to tol PO without N/V. Denies diarrhea, no CP/SOB, no back pain, no fevers, no black or blood in stools or emesis.    History reviewed. No pertinent past medical history.  There are no active problems to display for this patient.   Past Surgical History:  Procedure Laterality Date  . TUBAL LIGATION       OB History   None      Home Medications    Prior to Admission medications   Medication Sig Start Date End Date Taking? Authorizing Provider  ADDERALL XR 25 MG 24 hr capsule Take 20 mg by mouth every morning.  08/23/16  Yes [provider]  losartan (COZAAR) 50 MG tablet Take 50 mg by mouth daily. 06/29/16  Yes [provider]  HYDROcodone-acetaminophen (NORCO/VICODIN) 5-325 MG tablet Take 1 tablet by mouth every 4 (four) hours as needed. Patient not taking: Reported on 12/13/2017 12/03/16   Burgess Amor, PA-C  traMADol (ULTRAM) 50 MG tablet Take 50 mg by mouth every 8 (eight) hours as needed for muscle pain. 08/23/16   [provider]    Family History No family history on file.  Social History Social History   Tobacco Use  . Smoking status: Current Every Day Smoker    Packs/day: 0.50  . Smokeless tobacco: Never Used  Substance Use Topics  . Alcohol use: No  . Drug use: No     Allergies   Amoxicillin and Penicillins   Review of Systems Review of Systems ROS: Statement: All systems negative except as marked or noted in the HPI; Constitutional: Negative for fever and chills. ; ; Eyes: Negative for eye  pain, redness and discharge. ; ; ENMT: Negative for ear pain, hoarseness, nasal congestion, sinus pressure and sore throat. ; ; Cardiovascular: Negative for chest pain, palpitations, diaphoresis, dyspnea and peripheral edema. ; ; Respiratory: Negative for cough, wheezing and stridor. ; ; Gastrointestinal: +N/V, abd pain. Negative for diarrhea, blood in stool, hematemesis, jaundice and rectal bleeding. . ; ; Genitourinary: Negative for dysuria, flank pain and hematuria. ; ; Musculoskeletal: Negative for back pain and neck pain. Negative for swelling and trauma.; ; Skin: Negative for pruritus, rash, abrasions, blisters, bruising and skin lesion.; ; Neuro: Negative for headache, lightheadedness and neck stiffness. Negative for weakness, altered level of consciousness, altered mental status, extremity weakness, paresthesias, involuntary movement, seizure and syncope.       Physical Exam Updated Vital Signs BP (!) 134/117 (BP Location: Right Arm)   Pulse 70   Temp 97.7 F (36.5 C) (Oral)   Resp 18   Ht 5\' 7"  (1.702 m)   Wt 99.8 kg   LMP 12/09/2017   SpO2 100%   BMI 34.46 kg/m   Physical Exam 1150: Physical examination:  Nursing notes reviewed; Vital signs and O2 SAT reviewed;  Constitutional: Well developed, Well nourished, Well hydrated, In no acute distress; Head:  Normocephalic, atraumatic; Eyes: EOMI, PERRL, No scleral icterus; ENMT: Mouth and pharynx normal, Mucous membranes moist; Neck: Supple, Full  range of motion, No lymphadenopathy; Cardiovascular: Regular rate and rhythm, No gallop; Respiratory: Breath sounds clear & equal bilaterally, No wheezes.  Speaking full sentences with ease, Normal respiratory effort/excursion; Chest: Nontender, Movement normal; Abdomen: Soft, +mid-epigastric tenderness to palp. No rebound or guarding. Nondistended, Normal bowel sounds; Genitourinary: No CVA tenderness; Extremities: Peripheral pulses normal, No tenderness, No edema, No calf edema or asymmetry.;  Neuro: AA&Ox3, Major CN grossly intact.  Speech clear. No gross focal motor or sensory deficits in extremities.; Skin: Color normal, Warm, Dry.   ED Treatments / Results  Labs (all labs ordered are listed, but only abnormal results are displayed)   EKG None  Radiology   Procedures Procedures (including critical care time)  Medications Ordered in ED Medications  sodium chloride 0.9 % bolus 1,000 mL (1,000 mLs Intravenous New Bag/Given 12/13/17 1203)  ondansetron (ZOFRAN) injection 4 mg (4 mg Intravenous Given 12/13/17 1204)  famotidine (PEPCID) IVPB 20 mg premix (20 mg Intravenous New Bag/Given 12/13/17 1204)     Initial Impression / Assessment and Plan / ED Course  I have reviewed the triage vital signs and the nursing notes.  Pertinent labs & imaging results that were available during my care of the patient were reviewed by me and considered in my medical decision making (see chart for details).  MDM Reviewed: previous chart, nursing note and vitals Reviewed previous: labs Interpretation: labs, x-ray and ultrasound    Results for orders placed or performed during the hospital encounter of 12/13/17  Comprehensive metabolic panel  Result Value Ref Range   Sodium 140 135 - 145 mmol/L   Potassium 3.2 (L) 3.5 - 5.1 mmol/L   Chloride 109 98 - 111 mmol/L   CO2 25 22 - 32 mmol/L   Glucose, Bld 157 (H) 70 - 99 mg/dL   BUN 9 6 - 20 mg/dL   Creatinine, Ser 1.61 0.44 - 1.00 mg/dL   Calcium 8.2 (L) 8.9 - 10.3 mg/dL   Total Protein 7.9 6.5 - 8.1 g/dL   Albumin 4.0 3.5 - 5.0 g/dL   AST 19 15 - 41 U/L   ALT 17 0 - 44 U/L   Alkaline Phosphatase 77 38 - 126 U/L   Total Bilirubin 0.1 (L) 0.3 - 1.2 mg/dL   GFR calc non Af Amer >60 >60 mL/min   GFR calc Af Amer >60 >60 mL/min   Anion gap 6 5 - 15  Lipase, blood  Result Value Ref Range   Lipase 33 11 - 51 U/L  CBC with Differential  Result Value Ref Range   WBC 11.4 (H) 4.0 - 10.5 K/uL   RBC 5.13 (H) 3.87 - 5.11 MIL/uL    Hemoglobin 12.4 12.0 - 15.0 g/dL   HCT 09.6 04.5 - 40.9 %   MCV 79.3 (L) 80.0 - 100.0 fL   MCH 24.2 (L) 26.0 - 34.0 pg   MCHC 30.5 30.0 - 36.0 g/dL   RDW 81.1 (H) 91.4 - 78.2 %   Platelets 435 (H) 150 - 400 K/uL   nRBC 0.0 0.0 - 0.2 %   Neutrophils Relative % 83 %   Neutro Abs 9.5 (H) 1.7 - 7.7 K/uL   Lymphocytes Relative 12 %   Lymphs Abs 1.3 0.7 - 4.0 K/uL   Monocytes Relative 4 %   Monocytes Absolute 0.5 0.1 - 1.0 K/uL   Eosinophils Relative 0 %   Eosinophils Absolute 0.0 0.0 - 0.5 K/uL   Basophils Relative 0 %   Basophils Absolute 0.1 0.0 -  0.1 K/uL   Immature Granulocytes 1 %   Abs Immature Granulocytes 0.07 0.00 - 0.07 K/uL  Pregnancy, urine  Result Value Ref Range   Preg Test, Ur NEGATIVE NEGATIVE  Urinalysis, Routine w reflex microscopic  Result Value Ref Range   Color, Urine RED (A) YELLOW   APPearance CLOUDY (A) CLEAR   Glucose, UA (A) NEGATIVE mg/dL    TEST NOT REPORTED DUE TO COLOR INTERFERENCE OF URINE PIGMENT   Hgb urine dipstick (A) NEGATIVE    TEST NOT REPORTED DUE TO COLOR INTERFERENCE OF URINE PIGMENT   Bilirubin Urine (A) NEGATIVE    TEST NOT REPORTED DUE TO COLOR INTERFERENCE OF URINE PIGMENT   Ketones, ur (A) NEGATIVE mg/dL    TEST NOT REPORTED DUE TO COLOR INTERFERENCE OF URINE PIGMENT   Protein, ur (A) NEGATIVE mg/dL    TEST NOT REPORTED DUE TO COLOR INTERFERENCE OF URINE PIGMENT   Nitrite (A) NEGATIVE    TEST NOT REPORTED DUE TO COLOR INTERFERENCE OF URINE PIGMENT   Leukocytes, UA (A) NEGATIVE    TEST NOT REPORTED DUE TO COLOR INTERFERENCE OF URINE PIGMENT  Urinalysis, Microscopic (reflex)  Result Value Ref Range   RBC / HPF >50 0 - 5 RBC/hpf   WBC, UA 0-5 0 - 5 WBC/hpf   Bacteria, UA FEW (A) NONE SEEN   Squamous Epithelial / LPF 0-5 0 - 5   [place] Abdomen Limited  Result Date: 12/13/2017 CLINICAL DATA:  Nausea, vomiting and abdominal pain. EXAM: ULTRASOUND ABDOMEN LIMITED RIGHT UPPER QUADRANT COMPARISON:  CT 10/03/2015 FINDINGS: Gallbladder: No  gallstones or wall thickening visualized. No sonographic Murphy sign noted by sonographer. Common bile duct: Diameter: 2 mm.  Normal. Liver: Increased echogenicity suggesting fatty change. No focal lesion or ductal dilatation. Portal vein is patent on color Doppler imaging with normal direction of blood flow towards the liver. IMPRESSION: Probable fatty liver without focal lesion. No evidence of gallstones or other ductal pathology. Electronically Signed   By: Paulina FusiMark  Shogry M.D.   On: 12/13/2017 12:40   Dg Abd Acute W/chest Result Date: 12/13/2017 CLINICAL DATA:  Vomiting and abdominal pain EXAM: DG ABDOMEN ACUTE W/ 1V CHEST COMPARISON:  None. FINDINGS: Cardiac shadows within normal limits. The lungs are well aerated bilaterally. No focal infiltrate or sizable effusion is seen. No bony abnormality is noted. Scattered large and small bowel gas is noted. No obstructive changes are seen. No free air is noted. No acute bony abnormality is seen. IMPRESSION: No acute abnormality noted. Electronically Signed   By: Alcide CleverMark  Lukens M.D.   On: 12/13/2017 13:51     1410:  Potassium repleted PO. Pt has tol PO well while in the ED without N/V.  No stooling while in the ED.  Abd benign, VSS. Feels better and wants to go home now. Tx symptomatically at this time. Dx and testing d/w pt.  Questions answered.  Verb understanding, agreeable to d/c home with outpt f/u.    Final Clinical Impressions(s) / ED Diagnoses   Final diagnoses:  None    ED Discharge Orders    None       Samuel JesterMcManus, Evren Shankland, DO 12/17/17 [id number]

## 2019-02-14 ENCOUNTER — Encounter (HOSPITAL_COMMUNITY): Payer: Self-pay | Admitting: Emergency Medicine

## 2019-02-14 ENCOUNTER — Emergency Department (HOSPITAL_COMMUNITY): Payer: Medicaid Other

## 2019-02-14 ENCOUNTER — Other Ambulatory Visit: Payer: Self-pay

## 2019-02-14 ENCOUNTER — Emergency Department (HOSPITAL_COMMUNITY)
Admission: EM | Admit: 2019-02-14 | Discharge: 2019-02-15 | Disposition: A | Discharge status: Home / Self Care | Payer: Medicaid Other | Attending: Emergency Medicine | Admitting: Emergency Medicine

## 2019-02-14 DIAGNOSIS — Z79899 Other long term (current) drug therapy: Secondary | ICD-10-CM | POA: Diagnosis not present

## 2019-02-14 DIAGNOSIS — R220 Localized swelling, mass and lump, head: Secondary | ICD-10-CM | POA: Diagnosis not present

## 2019-02-14 DIAGNOSIS — F17210 Nicotine dependence, cigarettes, uncomplicated: Secondary | ICD-10-CM | POA: Insufficient documentation

## 2019-02-14 LAB — HCG, SERUM, QUALITATIVE: Preg, Serum: NEGATIVE

## 2019-02-14 LAB — CBC WITH DIFFERENTIAL/PLATELET
Abs Immature Granulocytes: 0.13 10*3/uL — ABNORMAL HIGH (ref 0.00–0.07)
Basophils Absolute: 0.1 10*3/uL (ref 0.0–0.1)
Basophils Relative: 1 %
Eosinophils Absolute: 0.3 10*3/uL (ref 0.0–0.5)
Eosinophils Relative: 2 %
HCT: 36.8 % (ref 36.0–46.0)
Hemoglobin: 11.4 g/dL — ABNORMAL LOW (ref 12.0–15.0)
Immature Granulocytes: 1 %
Lymphocytes Relative: 26 %
Lymphs Abs: 3.3 10*3/uL (ref 0.7–4.0)
MCH: 25.3 pg — ABNORMAL LOW (ref 26.0–34.0)
MCHC: 31 g/dL (ref 30.0–36.0)
MCV: 81.8 fL (ref 80.0–100.0)
Monocytes Absolute: 1.1 10*3/uL — ABNORMAL HIGH (ref 0.1–1.0)
Monocytes Relative: 8 %
Neutro Abs: 8 10*3/uL — ABNORMAL HIGH (ref 1.7–7.7)
Neutrophils Relative %: 62 %
Platelets: 421 10*3/uL — ABNORMAL HIGH (ref 150–400)
RBC: 4.5 MIL/uL (ref 3.87–5.11)
RDW: 16.3 % — ABNORMAL HIGH (ref 11.5–15.5)
WBC: 12.8 10*3/uL — ABNORMAL HIGH (ref 4.0–10.5)
nRBC: 0 % (ref 0.0–0.2)

## 2019-02-14 LAB — BASIC METABOLIC PANEL
Anion gap: 8 (ref 5–15)
BUN: 20 mg/dL (ref 6–20)
CO2: 22 mmol/L (ref 22–32)
Calcium: 8.5 mg/dL — ABNORMAL LOW (ref 8.9–10.3)
Chloride: 107 mmol/L (ref 98–111)
Creatinine, Ser: 0.68 mg/dL (ref 0.44–1.00)
GFR calc Af Amer: >60 mL/min (ref >60)
GFR calc non Af Amer: >60 mL/min (ref >60)
Glucose, Bld: 120 mg/dL — ABNORMAL HIGH (ref 70–99)
Potassium: 3.9 mmol/L (ref 3.5–5.1)
Sodium: 137 mmol/L (ref 135–145)

## 2019-02-14 LAB — LACTIC ACID, PLASMA: Lactic Acid, Venous: 0.8 mmol/L (ref 0.5–1.9)

## 2019-02-14 MED ORDER — MORPHINE SULFATE (PF) 4 MG/ML IV SOLN
4.0000 mg | Freq: Once | INTRAVENOUS | Status: AC
  Administered 2019-02-14: 4 mg via INTRAVENOUS
  Filled 2019-02-14: qty 1

## 2019-02-14 MED ORDER — VANCOMYCIN HCL IN DEXTROSE 1-5 GM/200ML-% IV SOLN
1000.0000 mg | Freq: Once | INTRAVENOUS | Status: AC
Start: 2019-02-14 — End: 2019-02-14
  Administered 2019-02-14: 1000 mg via INTRAVENOUS
  Filled 2019-02-14: qty 200

## 2019-02-14 MED ORDER — KETOROLAC TROMETHAMINE 30 MG/ML IJ SOLN
30.0000 mg | Freq: Once | INTRAMUSCULAR | Status: AC
  Administered 2019-02-14: 30 mg via INTRAVENOUS
  Filled 2019-02-14: qty 1

## 2019-02-14 MED ORDER — IOHEXOL 300 MG/ML  SOLN
75.0000 mL | Freq: Once | INTRAMUSCULAR | Status: AC | PRN
  Administered 2019-02-14: 75 mL via INTRAVENOUS

## 2019-02-14 NOTE — ED Triage Notes (Signed)
Pt seen at [facility] on yesterday for facial abscess (nasal) and given a shot of Rocephin and started on Doxycycline. Today, pt states eye was swollen, face more swollen, and had more drainage.

## 2019-02-14 NOTE — ED Provider Notes (Signed)
[facility] EMERGENCY DEPARTMENT Provider Note   CSN: [id number] Arrival date & time: 02/14/19  1931     History Chief Complaint  Patient presents with  . Abscess    Shannon Andrews is a 34 y.o. female.  Patient is a 34 year old female presenting to the emergency department with facial swelling and nasal abscess.  Patient reports that this has been worsening over the past 2 or 3 days.  She was seen by her primary care and was started on doxycycline and given an IM injection of Rocephin yesterday.  Reports she has had 3 doses of the oral doxycycline but the symptoms are getting worse.  Reports that she has having pain and swelling in the right side of her face.  There is drainage from the right nostril.  Denies any fever, chills, nausea, vomiting.        History reviewed. No pertinent past medical history.  There are no problems to display for this patient.   Past Surgical History:  Procedure Laterality Date  . TUBAL LIGATION       OB History   No obstetric history on file.     History reviewed. No pertinent family history.  Social History   Tobacco Use  . Smoking status: Current Every Day Smoker    Packs/day: 0.50  . Smokeless tobacco: Never Used  Substance Use Topics  . Alcohol use: No  . Drug use: No    Home Medications Prior to Admission medications   Medication Sig Start Date End Date Taking? Authorizing Provider  ADDERALL XR 25 MG 24 hr capsule Take 20 mg by mouth every morning.  08/23/16   [provider]  HYDROcodone-acetaminophen (NORCO/VICODIN) 5-325 MG tablet Take 1 tablet by mouth every 4 (four) hours as needed. Patient not taking: Reported on 12/13/2017 12/03/16   Burgess Amor, PA-C  losartan (COZAAR) 50 MG tablet Take 50 mg by mouth daily. 06/29/16   [provider]  ondansetron (ZOFRAN ODT) 4 MG disintegrating tablet Take 1 tablet (4 mg total) by mouth every 8 (eight) hours as needed for nausea or vomiting. 12/13/17   Samuel Jester,  DO  traMADol (ULTRAM) 50 MG tablet Take 50 mg by mouth every 8 (eight) hours as needed for muscle pain. 08/23/16   [provider]    Allergies    Amoxicillin and Penicillins  Review of Systems   Review of Systems  Constitutional: Negative for appetite change, chills and fever.  HENT: Positive for facial swelling. Negative for congestion, dental problem, mouth sores and nosebleeds.   Eyes: Positive for pain. Negative for visual disturbance.  Respiratory: Negative for cough and shortness of breath.   Cardiovascular: Negative for chest pain.  Gastrointestinal: Negative for nausea and vomiting.  Genitourinary: Negative for dysuria.  Skin: Negative for rash.  Allergic/Immunologic: Negative for immunocompromised state.  Neurological: Negative for dizziness, light-headedness and headaches.    Physical Exam Updated Vital Signs BP (!) 167/95 (BP Location: Right Arm)   Pulse 95   Temp 98.1 F (36.7 C) (Oral)   Resp 18   Ht 5\' 8"  (1.727 m)   Wt 103 kg   LMP 02/11/2019   SpO2 100%   BMI 34.52 kg/m   Physical Exam Vitals and nursing note reviewed.  Constitutional:      General: She is not in acute distress.    Appearance: Normal appearance. She is not ill-appearing, toxic-appearing or diaphoretic.  HENT:     Head: Normocephalic.     Comments: Patient has  swelling and scabbing to the inner portion of the lateral right nare.  She has swelling which extends to the right maxillary region and the right lower eyelid.    Mouth/Throat:     Comments: Poor dentition throughout.  No obvious dental abscess Eyes:     Conjunctiva/sclera: Conjunctivae normal.  Cardiovascular:     Rate and Rhythm: Normal rate.  Pulmonary:     Effort: Pulmonary effort is normal.  Skin:    General: Skin is dry.  Neurological:     Mental Status: She is alert.  Psychiatric:        Mood and Affect: Mood normal.     ED Results / Procedures / Treatments   Labs (all labs ordered are listed, but only  abnormal results are displayed) Labs Reviewed  CULTURE, BLOOD (ROUTINE X 2)  CULTURE, BLOOD (ROUTINE X 2)  BASIC METABOLIC PANEL  CBC WITH DIFFERENTIAL/PLATELET  LACTIC ACID, PLASMA  LACTIC ACID, PLASMA  I-STAT BETA HCG BLOOD, ED ([facility], [facility], [facility] ONLY)    EKG None  Radiology No results found.  Procedures Procedures (including critical care time)  Medications Ordered in ED Medications  vancomycin (VANCOCIN) IVPB 1000 mg/200 mL premix (has no administration in time range)  ketorolac (TORADOL) 30 MG/ML injection 30 mg (has no administration in time range)    ED Course  I have reviewed the triage vital signs and the nursing notes.  Pertinent labs & imaging results that were available during my care of the patient were reviewed by me and considered in my medical decision making (see chart for details).  Clinical Course as of Feb 13 2198  Wed Feb 14, 2019  2113 Patient has worsening facial abscess despite 1 day of oral doxycycline.  Patient is afebrile and does not appear septic but does have a swollen face with swollen right nare.  We will work the patient up for sepsis and get a CTA of her face to assess for worsening or extensive abscess.  We will give her a dose of IV vancomycin to cover for MRSA as well as IV Toradol.  If work-up and CT are reassuring patient can likely be discharged home with continued antibiotic treatment.   [KM]  2159 Patient care handed off to [facility] PA due to change of shift.    [KM]    Clinical Course User Index [KM] Kristine Royal   MDM Rules/Calculators/A&P                      Final Clinical Impression(s) / ED Diagnoses Final diagnoses:  None    Rx / DC Orders ED Discharge Orders    None       Kristine Royal 02/14/19 2200    Fredia Sorrow, MD 02/18/19 [phone]

## 2019-02-15 NOTE — ED Provider Notes (Signed)
Accepted handoff at shift change from [facility]. Please see prior provider note for more detail.   Briefly: Patient is 34 y.o. with no significant past medical history or sinus surgery presenting with facial swelling and right-sided nasal abscess.  Patient states that her symptoms began approximately 3 days ago and been worsening since.  She was seen by PCP yesterday and started on doxycycline and given 1 IM injection of Rocephin.   DDX: concern for preseptal cellulitis, orbital cellulitis, abscess, deep space infection, superficial cellulitis.  Plan: Follow-up on CT scan appropriately dispo.    Physical Exam  BP (!) 167/95 (BP Location: Right Arm)   Pulse 95   Temp 98.1 F (36.7 C) (Oral)   Resp 18   Ht 5\' 8"  (1.727 m)   Wt 103 kg   LMP 02/11/2019   SpO2 100%   BMI 34.52 kg/m   CONSTITUTIONAL:  well-appearing, NAD NEURO:  Alert and oriented x 3, no focal deficits EYES:  pupils equal and reactive, mild periorbital swelling below right eye.  No ophthalmoplegia, EOMI, pupils reactive.  No proptosis.  Mild tenderness to palpation inferior to right orbit.  Bilateral orbits soft with palpation. ENT/NECK:  trachea midline, no JVD CARDIO:  reg rate, reg rhythm, well-perfused PULM:  None labored breathing GI/GU:  Abdomen non-distended MSK/SPINE:  No gross deformities, no edema SKIN:  no rash obvious, atraumatic, no ecchymosis  PSYCH:  Appropriate speech and behavior   ED Course/Procedures   Clinical Course as of Feb 14 2  Wed Feb 14, 2019  2113 Patient has worsening facial abscess despite 1 day of oral doxycycline.  Patient is afebrile and does not appear septic but does have a swollen face with swollen right nare.  We will work the patient up for sepsis and get a CTA of her face to assess for worsening or extensive abscess.  We will give her a dose of IV vancomycin to cover for MRSA as well as IV Toradol.  If work-up and CT are reassuring patient can likely be discharged  home with continued antibiotic treatment.   [KM]  2159 Patient care handed off to [facility] PA due to change of shift.    [KM]    Clinical Course User Index [KM] [facility], PA-C    Procedures Results for orders placed or performed during the hospital encounter of 02/14/19  Blood culture (routine x 2)   Specimen: Left Antecubital; Blood  Result Value Ref Range   Specimen Description LEFT ANTECUBITAL    Special Requests      BOTTLES DRAWN AEROBIC AND ANAEROBIC Blood Culture adequate volume Performed at [facility], [address], [place], [place]    Culture PENDING    Report Status PENDING   Blood culture (routine x 2)   Specimen: Right Antecubital; Blood  Result Value Ref Range   Specimen Description RIGHT ANTECUBITAL    Special Requests      BOTTLES DRAWN AEROBIC AND ANAEROBIC Blood Culture adequate volume Performed at [facility], [address], [place], [place]    Culture PENDING    Report Status PENDING   Basic metabolic panel  Result Value Ref Range   Sodium 137 135 - 145 mmol/L   Potassium 3.9 3.5 - 5.1 mmol/L   Chloride 107 98 - 111 mmol/L   CO2 22 22 - 32 mmol/L   Glucose, Bld 120 (H) 70 - 99 mg/dL   BUN 20 6 - 20 mg/dL   Creatinine, Ser  0.68 0.44 - 1.00 mg/dL   Calcium 8.5 (L) 8.9 - 10.3 mg/dL   GFR calc non Af Amer >60 >60 mL/min   GFR calc Af Amer >60 >60 mL/min   Anion gap 8 5 - 15  CBC with Differential  Result Value Ref Range   WBC 12.8 (H) 4.0 - 10.5 K/uL   RBC 4.50 3.87 - 5.11 MIL/uL   Hemoglobin 11.4 (L) 12.0 - 15.0 g/dL   HCT 36.8 36.0 - 46.0 %   MCV 81.8 80.0 - 100.0 fL   MCH 25.3 (L) 26.0 - 34.0 pg   MCHC 31.0 30.0 - 36.0 g/dL   RDW 16.3 (H) 11.5 - 15.5 %   Platelets 421 (H) 150 - 400 K/uL   nRBC 0.0 0.0 - 0.2 %   Neutrophils Relative % 62 %   Neutro Abs 8.0 (H) 1.7 - 7.7 K/uL   Lymphocytes Relative 26 %   Lymphs Abs 3.3 0.7 - 4.0 K/uL   Monocytes Relative 8 %   Monocytes Absolute 1.1 (H) 0.1 - 1.0 K/uL    Eosinophils Relative 2 %   Eosinophils Absolute 0.3 0.0 - 0.5 K/uL   Basophils Relative 1 %   Basophils Absolute 0.1 0.0 - 0.1 K/uL   Immature Granulocytes 1 %   Abs Immature Granulocytes 0.13 (H) 0.00 - 0.07 K/uL  Lactic acid, plasma  Result Value Ref Range   Lactic Acid, Venous 0.8 0.5 - 1.9 mmol/L  hCG, serum, qualitative  Result Value Ref Range   Preg, Serum NEGATIVE NEGATIVE   CT Maxillofacial W Contrast  Result Date: 02/14/2019 CLINICAL DATA:  Facial swelling and possible nasal abscess. EXAM: CT MAXILLOFACIAL WITH CONTRAST TECHNIQUE: Multidetector CT imaging of the maxillofacial structures was performed with intravenous contrast. Multiplanar CT image reconstructions were also generated. CONTRAST:  94mL OMNIPAQUE IOHEXOL 300 MG/ML  SOLN COMPARISON:  None. FINDINGS: Osseous: No fracture or mandibular dislocation. No destructive process. Orbits: Negative. No traumatic or inflammatory finding. Sinuses: Clear. Soft tissues: There is induration of the right facial subcutaneous tissues, greatest at the nasal labial fold. There is no abscess or fluid collection. Limited intracranial: Normal IMPRESSION: Right facial swelling and subcutaneous induration greatest at the nasal labial fold. No abscess or fluid collection. Electronically Signed   By: Ulyses Jarred M.D.   On: 02/14/2019 23:22    MDM   Patient is well-appearing.  On my reexamination she is stating that her pain is somewhat improved.  Will give 1 dose of morphine as patient's pain is still severe.  She denies any fevers, chills, nausea, vomiting.  She states that she has only taken 3 doses of her doxycycline so far.  As this covers MRSA and strep and she has only been on a few doses so far with a very mild leukocytosis I suspect that the doxycycline will improve her symptoms given sufficient time.  I discussed with the patient.  I recommended a 2-day waiting.  With continued doxycycline use before reassessment by PCP.  If she has any new  or concerning symptoms such as ophthalmoplegia, vision changes/loss, proptosis or other concerning symptoms such as high fever nausea or vomiting she will return to ED immediately.  CT scan of face shows no deep space infection, abscess, evidence of orbital cellulitis.  It appears to be superficial cellulitis of the nasolabial fold.  Recommended Tylenol and ibuprofen for home use.  The medical records were personally reviewed by myself. I personally reviewed all lab results and interpreted all imaging studies  and either concurred with their official read or contacted radiology for clarification.   This patient appears reasonably screened and I doubt any other medical condition requiring further workup, evaluation, or treatment in the ED at this time prior to discharge.   Patient's vitals are WNL apart from vital sign abnormalities discussed above, patient is in NAD, and able to ambulate in the ED at their baseline and able to tolerate PO.  Pain has been managed or a plan has been made for home management and has no complaints prior to discharge. Patient is comfortable with above plan and for discharge at this time. All questions were answered prior to disposition. Results from the ER workup discussed with the patient face to face and all questions answered to the best of my ability. The patient is safe for discharge with strict return precautions. Patient appears safe for discharge with appropriate follow-up. Conveyed my impression with the patient and they voiced understanding and are agreeable to plan.   An After Visit Summary was printed and given to the patient.  Portions of this note were generated with Scientist, clinical (histocompatibility and immunogenetics). Dictation errors may occur despite best attempts at proofreading.     Solon Augusta Babcock, [place] 02/15/19 0011    Gilda Crease, MD 02/15/19 [phone]

## 2019-02-15 NOTE — Discharge Instructions (Addendum)
Please use Tylenol or ibuprofen for pain.  You may use 600 mg ibuprofen every 6 hours or 1000 mg of Tylenol every 6 hours.  You may choose to alternate between the 2.  This would be most effective.  Not to exceed 4 g of Tylenol within 24 hours.  Not to exceed 3200 mg ibuprofen 24 hours.   Please continue take doxycycline as prescribed.  Please follow-up with your PCP in 2 days for reevaluation.  If you have any new concerning symptoms including but not limited to fever, vision changes such as loss of vision, pain with moving her eyes or bulging of your eye please return immediately to ED.

## 2019-02-19 LAB — CULTURE, BLOOD (ROUTINE X 2)
Culture: NO GROWTH
Culture: NO GROWTH
Special Requests: ADEQUATE
Special Requests: ADEQUATE

## 2019-04-29 ENCOUNTER — Encounter (HOSPITAL_COMMUNITY): Payer: Self-pay | Admitting: Emergency Medicine

## 2019-04-29 ENCOUNTER — Emergency Department (HOSPITAL_COMMUNITY)
Admission: EM | Admit: 2019-04-29 | Discharge: 2019-04-30 | Disposition: A | Discharge status: Home / Self Care | Payer: Medicaid Other | Source: Home / Self Care | Attending: Emergency Medicine | Admitting: Emergency Medicine

## 2019-04-29 ENCOUNTER — Emergency Department (HOSPITAL_COMMUNITY)
Admission: EM | Admit: 2019-04-29 | Discharge: 2019-04-29 | Disposition: A | Discharge status: Home / Self Care | Payer: Medicaid Other | Attending: Emergency Medicine | Admitting: Emergency Medicine

## 2019-04-29 ENCOUNTER — Other Ambulatory Visit: Payer: Self-pay

## 2019-04-29 ENCOUNTER — Encounter (HOSPITAL_COMMUNITY): Payer: Self-pay | Admitting: *Deleted

## 2019-04-29 DIAGNOSIS — Z79899 Other long term (current) drug therapy: Secondary | ICD-10-CM | POA: Diagnosis not present

## 2019-04-29 DIAGNOSIS — K0889 Other specified disorders of teeth and supporting structures: Secondary | ICD-10-CM | POA: Insufficient documentation

## 2019-04-29 DIAGNOSIS — F17210 Nicotine dependence, cigarettes, uncomplicated: Secondary | ICD-10-CM | POA: Diagnosis not present

## 2019-04-29 DIAGNOSIS — I10 Essential (primary) hypertension: Secondary | ICD-10-CM | POA: Diagnosis not present

## 2019-04-29 DIAGNOSIS — K047 Periapical abscess without sinus: Secondary | ICD-10-CM

## 2019-04-29 HISTORY — DX: Essential (primary) hypertension: I10

## 2019-04-29 MED ORDER — LIDOCAINE-EPINEPHRINE (PF) 1 %-1:200000 IJ SOLN
10.0000 mL | Freq: Once | INTRAMUSCULAR | Status: DC
  Filled 2019-04-29: qty 30

## 2019-04-29 MED ORDER — CLINDAMYCIN HCL 300 MG PO CAPS: 300.0000 mg | ORAL_CAPSULE | Freq: Three times a day (TID) | ORAL | 0 refills | Status: AC

## 2019-04-29 MED ORDER — BENZOCAINE 20 % MT PSTE
PASTE | Freq: Once | OROMUCOSAL | Status: DC
  Filled 2019-04-29: qty 11.9

## 2019-04-29 MED ORDER — DICLOFENAC SODIUM 75 MG PO TBEC: 75.0000 mg | DELAYED_RELEASE_TABLET | Freq: Two times a day (BID) | ORAL | 0 refills | Status: AC

## 2019-04-29 NOTE — Discharge Instructions (Addendum)
Schedule dental evaluation and treatment

## 2019-04-29 NOTE — ED Provider Notes (Signed)
[facility] EMERGENCY DEPARTMENT Provider Note   CSN: [id number] Arrival date & time: 04/29/19  1215     History Chief Complaint  Patient presents with  . Dental Pain    Shannon Andrews is a 34 y.o. female.  The history is provided by the patient. No language interpreter was used.  Dental Pain Location:  Upper Upper teeth location:  9/LU central incisor Quality:  Aching Severity:  Moderate Onset quality:  Gradual Chronicity:  New Relieved by:  Nothing Ineffective treatments:  None tried Risk factors: periodontal disease        Past Medical History:  Diagnosis Date  . Hypertension     There are no problems to display for this patient.   Past Surgical History:  Procedure Laterality Date  . TUBAL LIGATION       OB History   No obstetric history on file.     No family history on file.  Social History   Tobacco Use  . Smoking status: Current Every Day Smoker    Packs/day: 0.50    Types: Cigarettes  . Smokeless tobacco: Never Used  Substance Use Topics  . Alcohol use: No  . Drug use: No    Home Medications Prior to Admission medications   Medication Sig Start Date End Date Taking? Authorizing Provider  ADDERALL XR 25 MG 24 hr capsule Take 20 mg by mouth every morning.  08/23/16   [provider]  clindamycin (CLEOCIN) 300 MG capsule Take 1 capsule (300 mg total) by mouth 3 (three) times daily for 10 days. 04/29/19 05/09/19  Fransico Meadow, PA-C  diclofenac (VOLTAREN) 75 MG EC tablet Take 1 tablet (75 mg total) by mouth 2 (two) times daily. 04/29/19   Fransico Meadow, PA-C  losartan (COZAAR) 50 MG tablet Take 50 mg by mouth daily. 06/29/16   [provider]  ondansetron (ZOFRAN ODT) 4 MG disintegrating tablet Take 1 tablet (4 mg total) by mouth every 8 (eight) hours as needed for nausea or vomiting. 12/13/17   Francine Graven, DO  traMADol (ULTRAM) 50 MG tablet Take 50 mg by mouth every 8 (eight) hours as needed for muscle pain. 08/23/16    [provider]    Allergies    Amoxicillin and Penicillins  Review of Systems   Review of Systems  All other systems reviewed and are negative.   Physical Exam Updated Vital Signs BP (!) 155/100 (BP Location: Right Arm)   Pulse 82   Temp 98.1 F (36.7 C) (Temporal)   Resp 16   Ht 5\' 6"  (1.676 m)   Wt 102.1 kg   LMP 04/29/2019 (Exact Date)   SpO2 100%   BMI 36.32 kg/m   Physical Exam Vitals and nursing note reviewed.  Constitutional:      Appearance: She is well-developed.  HENT:     Head: Normocephalic.     Mouth/Throat:     Comments: Dental decay, swelling roof of mouth near left upper central incisor, decay Cardiovascular:     Rate and Rhythm: Normal rate.  Pulmonary:     Effort: Pulmonary effort is normal.  Abdominal:     General: There is no distension.  Musculoskeletal:        General: Normal range of motion.  Neurological:     Mental Status: She is alert and oriented to person, place, and time.  Psychiatric:        Mood and Affect: Mood normal.     ED Results / Procedures /  Treatments   Labs (all labs ordered are listed, but only abnormal results are displayed) Labs Reviewed - No data to display  EKG None  Radiology No results found.  Procedures Procedures (including critical care time)  Medications Ordered in ED Medications - No data to display  ED Course  I have reviewed the triage vital signs and the nursing notes.  Pertinent labs & imaging results that were available during my care of the patient were reviewed by me and considered in my medical decision making (see chart for details).    MDM Rules/Calculators/A&P                      Pt has a dentist.  Pt advised of need to see dentist Final Clinical Impression(s) / ED Diagnoses Final diagnoses:  Toothache    Rx / DC Orders ED Discharge Orders         Ordered    clindamycin (CLEOCIN) 300 MG capsule  3 times daily     04/29/19 1336    diclofenac (VOLTAREN) 75 MG  EC tablet  2 times daily     04/29/19 1336        An After Visit Summary was printed and given to the patient.    Elson Areas, [place] 04/29/19 1406    Geoffery Lyons, MD 04/29/19 1454

## 2019-04-29 NOTE — ED Triage Notes (Signed)
Pt returns to er with continued pain to the top of her mouth,

## 2019-04-29 NOTE — ED Provider Notes (Signed)
[facility] EMERGENCY DEPARTMENT Provider Note   CSN: [id number] Arrival date & time: 04/29/19  2343     History Chief Complaint  Patient presents with  . Dental Pain    Shannon Andrews is a 34 y.o. female.  Patient presents with increased mouth pain.  Patient was seen earlier today and diagnosed with dental abscess.  She was started on Mobic and clindamycin but reports that pain is now worse, severe pressure around the area of left upper tooth.        Past Medical History:  Diagnosis Date  . Hypertension     There are no problems to display for this patient.   Past Surgical History:  Procedure Laterality Date  . TUBAL LIGATION       OB History   No obstetric history on file.     No family history on file.  Social History   Tobacco Use  . Smoking status: Current Every Day Smoker    Packs/day: 0.50    Types: Cigarettes  . Smokeless tobacco: Never Used  Substance Use Topics  . Alcohol use: No  . Drug use: No    Home Medications Prior to Admission medications   Medication Sig Start Date End Date Taking? Authorizing Provider  ADDERALL XR 25 MG 24 hr capsule Take 20 mg by mouth every morning.  08/23/16   [provider]  clindamycin (CLEOCIN) 300 MG capsule Take 1 capsule (300 mg total) by mouth 3 (three) times daily for 10 days. 04/29/19 05/09/19  Fransico Meadow, PA-C  diclofenac (VOLTAREN) 75 MG EC tablet Take 1 tablet (75 mg total) by mouth 2 (two) times daily. 04/29/19   Fransico Meadow, PA-C  losartan (COZAAR) 50 MG tablet Take 50 mg by mouth daily. 06/29/16   [provider]  ondansetron (ZOFRAN ODT) 4 MG disintegrating tablet Take 1 tablet (4 mg total) by mouth every 8 (eight) hours as needed for nausea or vomiting. 12/13/17   Francine Graven, DO  traMADol (ULTRAM) 50 MG tablet Take 50 mg by mouth every 8 (eight) hours as needed for muscle pain. 08/23/16   [provider]    Allergies    Amoxicillin and Penicillins  Review of  Systems   Review of Systems  Constitutional: Negative for fever.  HENT: Positive for dental problem.     Physical Exam Updated Vital Signs BP (!) 187/110   Pulse 90   Temp 98.1 F (36.7 C) (Oral)   Resp 20   Ht 5\' 6"  (1.676 m)   Wt 102 kg   LMP 04/29/2019 (Exact Date)   SpO2 100%   BMI 36.29 kg/m   Physical Exam Vitals and nursing note reviewed.  Constitutional:      General: She is not in acute distress.    Appearance: Normal appearance. She is well-developed.  HENT:     Head: Normocephalic and atraumatic.     Right Ear: Hearing normal.     Left Ear: Hearing normal.     Nose: Nose normal.     Mouth/Throat:     Comments: Tender fluctuant swelling area over hard palate adjacent to upper left central and lateral incisors.  Overlying mucosa is macerated. Eyes:     Conjunctiva/sclera: Conjunctivae normal.     Pupils: Pupils are equal, round, and reactive to light.  Cardiovascular:     Rate and Rhythm: Regular rhythm.     Heart sounds: S1 normal and S2 normal. No murmur. No friction rub. No gallop.  Pulmonary:     Effort: Pulmonary effort is normal. No respiratory distress.     Breath sounds: Normal breath sounds.  Chest:     Chest wall: No tenderness.  Abdominal:     General: Bowel sounds are normal.     Palpations: Abdomen is soft.     Tenderness: There is no abdominal tenderness. There is no guarding or rebound. Negative signs include Murphy's sign and McBurney's sign.     Hernia: No hernia is present.  Musculoskeletal:        General: Normal range of motion.     Cervical back: Normal range of motion and neck supple.  Skin:    General: Skin is warm and dry.     Findings: No rash.  Neurological:     Mental Status: She is alert and oriented to person, place, and time.     GCS: GCS eye subscore is 4. GCS verbal subscore is 5. GCS motor subscore is 6.     Cranial Nerves: No cranial nerve deficit.     Sensory: No sensory deficit.     Coordination: Coordination  normal.  Psychiatric:        Speech: Speech normal.        Behavior: Behavior normal.        Thought Content: Thought content normal.     ED Results / Procedures / Treatments   Labs (all labs ordered are listed, but only abnormal results are displayed) Labs Reviewed - No data to display  EKG None  Radiology No results found.  Procedures Procedures (including critical care time)  Medications Ordered in ED Medications  benzocaine (ORABASE-B) 20 % paste (has no administration in time range)  lidocaine-EPINEPHrine (XYLOCAINE-EPINEPHrine) 1 %-1:200000 (PF) injection 10 mL (has no administration in time range)    ED Course  I have reviewed the triage vital signs and the nursing notes.  Pertinent labs & imaging results that were available during my care of the patient were reviewed by me and considered in my medical decision making (see chart for details).    MDM Rules/Calculators/A&P                      Discussed with patient that she does have a fluctuant abscess on the roof of her mouth and might feel better if it was drained.  She did agree to this procedure.  Area was numbed with topical benzocaine.  I then explained to her that the area needed to be additionally numbed by injection.  She agreed to this as well.  Needle was introduced into the mucosa of the hard palate and after I started injecting, patient grabbed my hand and jerked the needle out of her mouth, causing me to hit my other hand with the needle.  At this point I did not feel comfortable performing any further procedure on her for my own safety.  I did see some pus starting to drain from the initial injection site, she is already on adequate antibiotic coverage.  She needs to follow-up with a dentist. Final Clinical Impression(s) / ED Diagnoses Final diagnoses:  Dental abscess    Rx / DC Orders ED Discharge Orders    None       Serah Nicoletti, Canary Brim, MD 04/30/19 0124

## 2019-04-29 NOTE — ED Triage Notes (Signed)
Concerned that she has an area to the roof of her mouth that is painful   Has not visited a dentist in years  Reports the "pocket" on the roof of her mouth is painful and she is not relieved with OTC meds   Here for eval and pain relief

## 2019-04-30 NOTE — Discharge Instructions (Addendum)
Please see a dentist

## 2021-01-05 ENCOUNTER — Encounter: Payer: Self-pay | Admitting: Emergency Medicine

## 2021-01-05 ENCOUNTER — Other Ambulatory Visit: Payer: Self-pay

## 2021-01-05 ENCOUNTER — Ambulatory Visit
Admission: EM | Admit: 2021-01-05 | Discharge: 2021-01-05 | Disposition: A | Discharge status: Home / Self Care | Payer: Medicaid Other

## 2021-01-05 DIAGNOSIS — K047 Periapical abscess without sinus: Secondary | ICD-10-CM

## 2021-01-05 DIAGNOSIS — K029 Dental caries, unspecified: Secondary | ICD-10-CM

## 2021-01-05 MED ORDER — CLINDAMYCIN HCL 300 MG PO CAPS: 300.0000 mg | ORAL_CAPSULE | Freq: Three times a day (TID) | ORAL | 0 refills | Status: AC

## 2021-01-05 NOTE — Discharge Instructions (Addendum)
Take the antibiotic as prescribed.    A dental resource guide is attached.  Please call to make an appointment with a dentist as soon as possible.    Go to the emergency department if you have acute worsening symptoms.     

## 2021-01-05 NOTE — ED Provider Notes (Signed)
Renaldo Fiddler    CSN: [id number] Arrival date & time: 01/05/21  1718      History   Chief Complaint Chief Complaint  Patient presents with   Oral Swelling    HPI Shannon Andrews is a 35 y.o. female.  Patient presents with left lower tooth ache and jaw swelling x3 days.  She denies fever, difficulty swallowing, shortness of breath, or other symptoms.  Treatment at home with Tylenol.  She has not seen a dentist.  LMP 3 weeks ago.  Patient denies pregnancy or breast-feeding.  Her medical history includes hypertension.  The history is provided by the patient and medical records.   Past Medical History:  Diagnosis Date   Hypertension     There are no problems to display for this patient.   Past Surgical History:  Procedure Laterality Date   TUBAL LIGATION      OB History   No obstetric history on file.      Home Medications    Prior to Admission medications   Medication Sig Start Date End Date Taking? Authorizing Provider  clindamycin (CLEOCIN) 300 MG capsule Take 1 capsule (300 mg total) by mouth 3 (three) times daily. 01/05/21  Yes Mickie Bail, NP  losartan (COZAAR) 50 MG tablet Take 50 mg by mouth daily. 06/29/16  Yes [provider]  ADDERALL XR 25 MG 24 hr capsule Take 20 mg by mouth every morning.  08/23/16   [provider]  diclofenac (VOLTAREN) 75 MG EC tablet Take 1 tablet (75 mg total) by mouth 2 (two) times daily. 04/29/19   Elson Areas, PA-C  hydrochlorothiazide (HYDRODIURIL) 12.5 MG tablet Take 12.5 mg by mouth daily. 08/26/20   [provider]  ondansetron (ZOFRAN ODT) 4 MG disintegrating tablet Take 1 tablet (4 mg total) by mouth every 8 (eight) hours as needed for nausea or vomiting. 12/13/17   Samuel Jester, DO  traMADol (ULTRAM) 50 MG tablet Take 50 mg by mouth every 8 (eight) hours as needed for muscle pain. 08/23/16   [provider]    Family History History reviewed. No pertinent family  history.  Social History Social History   Tobacco Use   Smoking status: Every Day    Packs/day: 0.50    Types: Cigarettes   Smokeless tobacco: Never  Vaping Use   Vaping Use: Never used  Substance Use Topics   Alcohol use: No   Drug use: No     Allergies   Amoxicillin and Penicillins   Review of Systems Review of Systems  Constitutional:  Negative for chills and fever.  HENT:  Positive for dental problem and facial swelling. Negative for ear pain and sore throat.   Respiratory:  Negative for cough and shortness of breath.   Skin:  Negative for color change and rash.  All other systems reviewed and are negative.   Physical Exam Triage Vital Signs ED Triage Vitals  Enc Vitals Group     BP      Pulse      Resp      Temp      Temp src      SpO2      Weight      Height      Head Circumference      Peak Flow      Pain Score      Pain Loc      Pain Edu?      Excl. in GC?  No data found.  Updated Vital Signs BP (!) 141/89 (BP Location: Left Arm)    Pulse 73    Temp 99.1 F (37.3 C) (Oral)    Resp 18    LMP  (LMP Unknown)    SpO2 98%   Visual Acuity Right Eye Distance:   Left Eye Distance:   Bilateral Distance:    Right Eye Near:   Left Eye Near:    Bilateral Near:     Physical Exam Vitals and nursing note reviewed.  Constitutional:      General: She is not in acute distress.    Appearance: She is well-developed.  HENT:     Mouth/Throat:     Mouth: Mucous membranes are moist.     Dentition: Abnormal dentition. Dental tenderness and dental caries present.      Comments: Left lower facial swelling.  Teeth in poor repair. Several broken teeth.  No difficulty swallowing.  Speech clear.  Cardiovascular:     Rate and Rhythm: Normal rate and regular rhythm.     Heart sounds: Normal heart sounds.  Pulmonary:     Effort: Pulmonary effort is normal. No respiratory distress.     Breath sounds: Normal breath sounds.  Musculoskeletal:     Cervical back:  Neck supple.  Skin:    General: Skin is warm and dry.  Neurological:     Mental Status: She is alert.  Psychiatric:        Mood and Affect: Mood normal.        Behavior: Behavior normal.     UC Treatments / Results  Labs (all labs ordered are listed, but only abnormal results are displayed) Labs Reviewed - No data to display  EKG   Radiology No results found.  Procedures Procedures (including critical care time)  Medications Ordered in UC Medications - No data to display  Initial Impression / Assessment and Plan / UC Course  I have reviewed the triage vital signs and the nursing notes.  Pertinent labs & imaging results that were available during my care of the patient were reviewed by me and considered in my medical decision making (see chart for details).   Dental abscess; dental pain due to dental caries.  Patient is allergic to penicillin.  Treating with clindamycin.  Education provided on dental abscess.  Dental resource guide provided and patient instructed to schedule an appointment with a dentist as soon as possible.  ED precautions discussed.  Tylenol or ibuprofen as needed for discomfort.  She agrees to plan of care.   Final Clinical Impressions(s) / UC Diagnoses   Final diagnoses:  Dental abscess  Pain due to dental caries     Discharge Instructions      Take the antibiotic as prescribed.    A dental resource guide is attached.  Please call to make an appointment with a dentist as soon as possible.    Go to the emergency department if you have acute worsening symptoms.         ED Prescriptions     Medication Sig Dispense Auth. Provider   clindamycin (CLEOCIN) 300 MG capsule Take 1 capsule (300 mg total) by mouth 3 (three) times daily. 21 capsule Mickie Bail, NP      PDMP not reviewed this encounter.   Mickie Bail, NP 01/05/21 [phone]

## 2021-01-05 NOTE — ED Triage Notes (Signed)
Pt presents with left side jaw swelling x 3 days

## 2023-11-02 ENCOUNTER — Ambulatory Visit: Admitting: Surgical

## 2023-11-14 ENCOUNTER — Encounter: Payer: Self-pay | Admitting: Radiology

## 2024-02-01 ENCOUNTER — Ambulatory Visit: Admitting: Surgical

## 2024-02-08 ENCOUNTER — Ambulatory Visit: Admitting: Surgical
